# Patient Record
Sex: Male | Born: 1970 | Race: White | Hispanic: No | Marital: Married | Smoking: Never smoker
Health system: Southern US, Community
[De-identification: ages and names within clinical notes are randomized; demographics above are authoritative.]

## PROBLEM LIST (undated history)

## (undated) DIAGNOSIS — E079 Disorder of thyroid, unspecified: Secondary | ICD-10-CM

## (undated) DIAGNOSIS — U071 COVID-19: Secondary | ICD-10-CM

## (undated) DIAGNOSIS — F419 Anxiety disorder, unspecified: Secondary | ICD-10-CM

## (undated) HISTORY — PX: HIP SURGERY: SHX245

## (undated) HISTORY — PX: CHOLECYSTECTOMY: SHX55

## (undated) HISTORY — PX: KNEE SURGERY: SHX244

---

## 2009-08-07 ENCOUNTER — Ambulatory Visit: Payer: Self-pay | Admitting: Family Medicine

## 2009-08-07 DIAGNOSIS — M545 Low back pain, unspecified: Secondary | ICD-10-CM | POA: Insufficient documentation

## 2009-09-25 ENCOUNTER — Ambulatory Visit: Payer: Self-pay | Admitting: Family Medicine

## 2009-09-25 DIAGNOSIS — S61209A Unspecified open wound of unspecified finger without damage to nail, initial encounter: Secondary | ICD-10-CM | POA: Insufficient documentation

## 2010-04-21 ENCOUNTER — Ambulatory Visit: Payer: Self-pay | Admitting: Diagnostic Radiology

## 2010-04-21 ENCOUNTER — Emergency Department (HOSPITAL_BASED_OUTPATIENT_CLINIC_OR_DEPARTMENT_OTHER): Admission: EM | Admit: 2010-04-21 | Discharge: 2010-04-21 | Payer: Self-pay | Admitting: Emergency Medicine

## 2011-03-08 LAB — BASIC METABOLIC PANEL WITH GFR
BUN: 15 mg/dL (ref 6–23)
Calcium: 9.5 mg/dL (ref 8.4–10.5)
Chloride: 107 meq/L (ref 96–112)
GFR calc Af Amer: >60 mL/min (ref >60)
Glucose, Bld: 99 mg/dL (ref 70–99)

## 2011-03-08 LAB — BASIC METABOLIC PANEL
CO2: 29 mEq/L (ref 19–32)
Creatinine, Ser: 1 mg/dL (ref 0.4–1.5)
GFR calc non Af Amer: >60 mL/min (ref >60)
Potassium: 4.8 mEq/L (ref 3.5–5.1)
Sodium: 143 mEq/L (ref 135–145)

## 2012-10-18 ENCOUNTER — Emergency Department
Admission: EM | Admit: 2012-10-18 | Discharge: 2012-10-18 | Disposition: A | Discharge status: Home / Self Care | Payer: BC Managed Care – PPO | Source: Home / Self Care | Attending: Family Medicine | Admitting: Family Medicine

## 2012-10-18 ENCOUNTER — Encounter: Payer: Self-pay | Admitting: *Deleted

## 2012-10-18 DIAGNOSIS — IMO0002 Reserved for concepts with insufficient information to code with codable children: Secondary | ICD-10-CM

## 2012-10-18 DIAGNOSIS — L600 Ingrowing nail: Secondary | ICD-10-CM

## 2012-10-18 HISTORY — DX: Anxiety disorder, unspecified: F41.9

## 2012-10-18 MED ORDER — HYDROCODONE-ACETAMINOPHEN 5-500 MG PO TABS: 1.0000 | ORAL_TABLET | Freq: Every evening | ORAL | Status: DC | PRN

## 2012-10-18 MED ORDER — CEPHALEXIN 500 MG PO CAPS: 500.0000 mg | ORAL_CAPSULE | Freq: Three times a day (TID) | ORAL | Status: DC

## 2012-10-18 NOTE — ED Notes (Signed)
Patient c/o ingrown toe nail right great toe x 2 wks. Pain, swelling, redness, and discharge.

## 2012-10-18 NOTE — ED Provider Notes (Signed)
History     CSN: [id number]  Arrival date & time 10/18/12  1642   First MD Initiated Contact with Patient 10/18/12 1706      Chief Complaint  Patient presents with  . Ingrown Toenail      HPI Comments: Patient developed an ingrown toenail of his right great toe about two weeks ago.  He tried to trim it and it subsequently became red, swollen, and painful.  He has had persistent recurring purulent drainage from the area.  Patient is a 41 y.o. male presenting with toe pain. The history is provided by the patient.  Toe Pain This is a new problem. Episode onset: 2 weeks ago. The problem occurs constantly. The problem has been gradually worsening. Associated symptoms comments: none. The symptoms are aggravated by walking and standing. Nothing relieves the symptoms. Treatments tried: warm soaks. The treatment provided no relief.    Past Medical History  Diagnosis Date  . Anxiety     Past Surgical History  Procedure Date  . Knee surgery   . Cholecystectomy     History reviewed. No pertinent family history.  History  Substance Use Topics  . Smoking status: Never Smoker   . Smokeless tobacco: Never Used  . Alcohol Use: No      Review of Systems  All other systems reviewed and are negative.    Allergies  Amoxicillin  Home Medications   Current Outpatient Rx  Name Route Sig Dispense Refill  . ZOLOFT PO Oral Take by mouth.    . CEPHALEXIN 500 MG PO CAPS Oral Take 1 capsule (500 mg total) by mouth 3 (three) times daily. 30 capsule 0  . HYDROCODONE-ACETAMINOPHEN 5-500 MG PO TABS Oral Take 1 tablet by mouth at bedtime as needed for pain. 8 tablet 0    BP 151/85  Pulse 76  Temp 98.8 F (37.1 C) (Oral)  Resp 20  Ht 6\' 2"  (1.88 m)  Wt 260 lb (117.935 kg)  BMI 33.38 kg/m2  SpO2 99%  Physical Exam  Nursing note and vitals reviewed. Constitutional: He is oriented to person, place, and time. He appears well-developed and well-nourished. No distress.  Eyes:  Conjunctivae normal are normal.  Musculoskeletal:       Feet:       Right great toenail has erythema, swelling, and tenderness along its lateral edge as noted on diagram.  There is crusted discharge present.  Neurological: He is alert and oriented to person, place, and time.  Skin: Skin is warm and dry. There is erythema.    ED Course  Procedures none   Labs Reviewed  WOUND CULTURE pending      1. Ingrown right big toenail   2. Paronychia       MDM  Wound culture pending Begin Keflex.  Lortab at bedtime for pain. Continue warm soaks two or three times daily.  Wear bandage and change daily. Return in about 4 to 5 days for partial toenail resection                                   Lattie Haw, MD 10/18/12 1735

## 2012-10-21 ENCOUNTER — Telehealth: Payer: Self-pay | Admitting: *Deleted

## 2012-10-21 LAB — WOUND CULTURE

## 2013-08-08 ENCOUNTER — Emergency Department
Admission: EM | Admit: 2013-08-08 | Discharge: 2013-08-08 | Disposition: A | Discharge status: Home / Self Care | Payer: BC Managed Care – PPO | Source: Home / Self Care | Attending: Emergency Medicine | Admitting: Emergency Medicine

## 2013-08-08 ENCOUNTER — Encounter: Payer: Self-pay | Admitting: Emergency Medicine

## 2013-08-08 DIAGNOSIS — J069 Acute upper respiratory infection, unspecified: Secondary | ICD-10-CM

## 2013-08-08 MED ORDER — GUAIFENESIN-CODEINE 100-10 MG/5ML PO SYRP: 5.0000 mL | ORAL_SOLUTION | Freq: Four times a day (QID) | ORAL | Status: DC | PRN

## 2013-08-08 MED ORDER — AZITHROMYCIN 250 MG PO TABS: ORAL_TABLET | ORAL | Status: DC

## 2013-08-08 NOTE — ED Provider Notes (Signed)
  CSN: [id number]     Arrival date & time 08/08/13  1191 History     First MD Initiated Contact with Patient 08/08/13 [id number]     No chief complaint on file.  (Consider location/radiation/quality/duration/timing/severity/associated sxs/prior Treatment) HPI Keith Wong is a 42 y.o. male who complains of onset of cold symptoms for 3 days.  The symptoms are constant and mild-moderate in severity.  Taking Sudafed which helps some. + sore throat No cough No pleuritic pain No wheezing + nasal congestion + post-nasal drainage + sinus pain/pressure No chest congestion No itchy/red eyes No earache No hemoptysis No SOB No chills/sweats No fever No nausea No vomiting No abdominal pain No diarrhea No skin rashes No fatigue No myalgias + headache     Past Medical History  Diagnosis Date  . Anxiety    Past Surgical History  Procedure Laterality Date  . Knee surgery    . Cholecystectomy     No family history on file. History  Substance Use Topics  . Smoking status: Never Smoker   . Smokeless tobacco: Never Used  . Alcohol Use: No    Review of Systems  All other systems reviewed and are negative.    Allergies  Amoxicillin  Home Medications   Current Outpatient Rx  Name  Route  Sig  Dispense  Refill  . cephALEXin (KEFLEX) 500 MG capsule   Oral   Take 1 capsule (500 mg total) by mouth 3 (three) times daily.   30 capsule   0   . HYDROcodone-acetaminophen (VICODIN) 5-500 MG per tablet   Oral   Take 1 tablet by mouth at bedtime as needed for pain.   8 tablet   0   . Sertraline HCl (ZOLOFT PO)   Oral   Take by mouth.          There were no vitals taken for this visit. Physical Exam  Nursing note and vitals reviewed. Constitutional: He is oriented to person, place, and time. He appears well-developed and well-nourished.  HENT:  Head: Normocephalic and atraumatic.  Right Ear: Tympanic membrane, external ear and ear canal normal.  Left Ear: Tympanic membrane,  external ear and ear canal normal.  Nose: Mucosal edema and rhinorrhea present.  Mouth/Throat: Posterior oropharyngeal erythema present. No oropharyngeal exudate or posterior oropharyngeal edema.  Eyes: No scleral icterus.  Neck: Neck supple.  Cardiovascular: Regular rhythm and normal heart sounds.   Pulmonary/Chest: Effort normal and breath sounds normal. No respiratory distress.  Neurological: He is alert and oriented to person, place, and time.  Skin: Skin is warm and dry.  Psychiatric: He has a normal mood and affect. His speech is normal.    ED Course   Procedures (including critical care time)  Labs Reviewed - No data to display No results found. 1. Acute upper respiratory infections of unspecified site     MDM  1)  Take the prescribed antibiotic as instructed. 2)  Use nasal saline solution (over the counter) at least 3 times a day. 3)  Use over the counter decongestants like Zyrtec-D every 12 hours as needed to help with congestion.  If you have hypertension, do not take medicines with sudafed.  4)  Can take tylenol every 6 hours or motrin every 8 hours for pain or fever. 5)  Follow up with your primary doctor if no improvement in 5-7 days, sooner if increasing pain, fever, or new symptoms.     Marlaine Hind, MD 08/08/13 [id number]

## 2013-08-08 NOTE — ED Notes (Signed)
Sinus pain pressure, congestion, runny nose, headache x 3 days

## 2013-12-27 ENCOUNTER — Emergency Department (INDEPENDENT_AMBULATORY_CARE_PROVIDER_SITE_OTHER)
Admission: EM | Admit: 2013-12-27 | Discharge: 2013-12-27 | Disposition: A | Discharge status: Home / Self Care | Payer: BC Managed Care – PPO | Source: Home / Self Care | Attending: Family Medicine | Admitting: Family Medicine

## 2013-12-27 ENCOUNTER — Encounter: Payer: Self-pay | Admitting: Emergency Medicine

## 2013-12-27 DIAGNOSIS — J069 Acute upper respiratory infection, unspecified: Secondary | ICD-10-CM

## 2013-12-27 MED ORDER — AZITHROMYCIN 250 MG PO TABS: ORAL_TABLET | ORAL | Status: DC

## 2013-12-27 MED ORDER — HYDROCOD POLST-CHLORPHEN POLST 10-8 MG/5ML PO LQCR: 5.0000 mL | Freq: Every evening | ORAL | Status: DC | PRN

## 2013-12-27 NOTE — ED Notes (Signed)
Pt c/o dry cough, HA, sneezing, nasal congestion without fever x 5 days. No flu vac this season.

## 2013-12-27 NOTE — Discharge Instructions (Signed)
Take plain Mucinex (1200 mg guaifenesin) twice daily for cough and congestion.  May add Sudafed for sinus congestion.   Increase fluid intake, rest. May use Afrin nasal spray (or generic oxymetazoline) twice daily for about 5 days.  Also recommend using saline nasal spray several times daily and saline nasal irrigation (AYR is a common brand) Try warm salt water gargles for sore throat.  Stop all antihistamines for now, and other non-prescription cough/cold preparations. May take Ibuprofen 200mg , 4 tabs every 8 hours with food for sore throat, headache, etc. Begin Azithromycin if not improving about 5 days or if persistent fever develops   Follow-up with family doctor if not improving about10 days.

## 2013-12-27 NOTE — ED Provider Notes (Signed)
CSN: [id number]     Arrival date & time 12/27/13  1351 History   First MD Initiated Contact with Patient 12/27/13 1437     Chief Complaint  Patient presents with  . Cough  . Nasal Congestion      HPI Comments: Patient complains of five day history of gradually developing URI symptoms with mild sore throat, sinus congestion, and cough.  The history is provided by the patient.    Past Medical History  Diagnosis Date  . Anxiety    Past Surgical History  Procedure Laterality Date  . Knee surgery    . Cholecystectomy     History reviewed. No pertinent family history. History  Substance Use Topics  . Smoking status: Never Smoker   . Smokeless tobacco: Never Used  . Alcohol Use: No    Review of Systems + sore throat + cough + hoarseness No pleuritic pain No wheezing + nasal congestion + post-nasal drainage No sinus pain/pressure No itchy/red eyes No earache No hemoptysis No SOB No fever, + chills/sweats + nausea No vomiting No abdominal pain No diarrhea No urinary symptoms No skin rash + fatigue No myalgias + headache Used OTC meds without relief  Allergies  Amoxicillin  Home Medications   Current Outpatient Rx  Name  Route  Sig  Dispense  Refill  . montelukast (SINGULAIR) 10 MG tablet   Oral   Take 10 mg by mouth at bedtime.         Marland Kitchen. azithromycin (ZITHROMAX Z-PAK) 250 MG tablet      Take 2 tabs today; then begin one tab once daily for 4 more days. (Rx void after 01/04/14)   6 each   0   . chlorpheniramine-HYDROcodone (TUSSIONEX PENNKINETIC ER) 10-8 MG/5ML LQCR   Oral   Take 5 mLs by mouth at bedtime as needed for cough.   115 mL   0    BP 132/89  Pulse 79  Temp(Src) 98.9 F (37.2 C) (Oral)  Resp 16  Ht 6\' 2"  (1.88 m)  Wt 265 lb (120.203 kg)  BMI 34.01 kg/m2  SpO2 99% Physical Exam Nursing notes and Vital Signs reviewed. Appearance:  Patient appears healthy, stated age, and in no acute distress Eyes:  Pupils are equal, round, and  reactive to light and accomodation.  Extraocular movement is intact.  Conjunctivae are not inflamed  Ears:  Canals normal.  Tympanic membranes normal.  Nose:  Mildly congested turbinates.  No sinus tenderness.    Pharynx:  Normal Neck:  Supple.   Non-tender posterior nodes are palpated bilaterally  Lungs:  Clear to auscultation.  Breath sounds are equal.  Heart:  Regular rate and rhythm without murmurs, rubs, or gallops.  Abdomen:  Nontender without masses or hepatosplenomegaly.  Bowel sounds are present.  No CVA or flank tenderness.  Extremities:  No edema.  No calf tenderness Skin:  No rash present.   ED Course  Procedures  none      MDM   1. Acute upper respiratory infections of unspecified site; suspect viral URI    There is no evidence of bacterial infection today.  Rx for Tussionex at bedtime prn Take plain Mucinex (1200 mg guaifenesin) twice daily for cough and congestion.  May add Sudafed for sinus congestion.   Increase fluid intake, rest. May use Afrin nasal spray (or generic oxymetazoline) twice daily for about 5 days.  Also recommend using saline nasal spray several times daily and saline nasal irrigation (AYR is a common brand) Try  warm salt water gargles for sore throat.  Stop all antihistamines for now, and other non-prescription cough/cold preparations. May take Ibuprofen 200mg , 4 tabs every 8 hours with food for sore throat, headache, etc. Begin Azithromycin if not improving about 5 days or if persistent fever develops (Given a prescription to hold, with an expiration date)  Follow-up with family doctor if not improving about10 days.     Lattie Haw, MD 12/29/13 1048

## 2015-04-02 ENCOUNTER — Emergency Department
Admission: EM | Admit: 2015-04-02 | Discharge: 2015-04-02 | Disposition: A | Discharge status: Home / Self Care | Payer: BLUE CROSS/BLUE SHIELD | Source: Home / Self Care | Attending: Emergency Medicine | Admitting: Emergency Medicine

## 2015-04-02 ENCOUNTER — Encounter: Payer: Self-pay | Admitting: *Deleted

## 2015-04-02 DIAGNOSIS — J322 Chronic ethmoidal sinusitis: Secondary | ICD-10-CM

## 2015-04-02 HISTORY — DX: Disorder of thyroid, unspecified: E07.9

## 2015-04-02 MED ORDER — AZITHROMYCIN 250 MG PO TABS: ORAL_TABLET | ORAL | Status: DC

## 2015-04-02 MED ORDER — HYDROCODONE-HOMATROPINE 5-1.5 MG/5ML PO SYRP: 5.0000 mL | ORAL_SOLUTION | Freq: Four times a day (QID) | ORAL | Status: DC | PRN

## 2015-04-02 MED ORDER — PREDNISONE 10 MG PO TABS: ORAL_TABLET | ORAL | Status: DC

## 2015-04-02 NOTE — Discharge Instructions (Signed)

## 2015-04-02 NOTE — ED Provider Notes (Signed)
CSN: [id number]     Arrival date & time 04/02/15  1009 History   First MD Initiated Contact with Patient 04/02/15 1054     Chief Complaint  Patient presents with  . Sinus Problem  . Cough   (Consider location/radiation/quality/duration/timing/severity/associated sxs/prior Treatment) Patient is a 44 y.o. male presenting with sinus complaint and cough. The history is provided by the patient. No language interpreter was used.  Sinus Problem This is a new problem. The current episode started 2 days ago. The problem occurs constantly. The problem has been gradually worsening. Associated symptoms include headaches. Pertinent negatives include no shortness of breath. Nothing aggravates the symptoms. Nothing relieves the symptoms. He has tried nothing for the symptoms. The treatment provided no relief.  Cough Associated symptoms: headaches   Associated symptoms: no shortness of breath   Pt complains of sinus congestion, cough and congestion.  Pt reports he has had multiple sinus infections.  Pt has had 3 since December.  Past Medical History  Diagnosis Date  . Anxiety   . Thyroid disease    Past Surgical History  Procedure Laterality Date  . Knee surgery    . Cholecystectomy     History reviewed. No pertinent family history. History  Substance Use Topics  . Smoking status: Never Smoker   . Smokeless tobacco: Never Used  . Alcohol Use: No    Review of Systems  HENT: Positive for sinus pressure.   Respiratory: Positive for cough. Negative for shortness of breath.   Neurological: Positive for headaches.  All other systems reviewed and are negative.   Allergies  Amoxicillin  Home Medications   Prior to Admission medications   Medication Sig Start Date End Date Taking? Authorizing Provider  buPROPion (WELLBUTRIN) 75 MG tablet Take 25 mg by mouth 2 (two) times daily.   Yes Historical Provider, MD  LEVOTHYROXINE SODIUM PO Take 0.25 mcg by mouth.   Yes Historical Provider, MD   azithromycin (ZITHROMAX Z-PAK) 250 MG tablet Two tablets 1st day then one a day 04/02/15   Elson Areas, PA-C  HYDROcodone-homatropine (HYDROMET) 5-1.5 MG/5ML syrup Take 5 mLs by mouth every 6 (six) hours as needed for cough. 04/02/15   Elson Areas, PA-C  montelukast (SINGULAIR) 10 MG tablet Take 10 mg by mouth at bedtime.    Historical Provider, MD  predniSONE (DELTASONE) 10 MG tablet 6,5,4,3,2,1 taper 04/02/15   Lonia Skinner Kaegan Hettich, PA-C   BP 132/90 mmHg  Pulse 101  Temp(Src) 98 F (36.7 C) (Oral)  Resp 16  Wt 252 lb (114.306 kg)  SpO2 99% Physical Exam  Constitutional: He is oriented to person, place, and time. He appears well-developed and well-nourished.  HENT:  Head: Normocephalic and atraumatic.  Right Ear: External ear normal.  Mouth/Throat: Oropharynx is clear and moist.  Tender maxillary sinuses and frontal sinus  Eyes: Conjunctivae and EOM are normal. Pupils are equal, round, and reactive to light.  Neck: Normal range of motion.  Cardiovascular: Normal rate.   Pulmonary/Chest: Effort normal.  Abdominal: Soft.  Musculoskeletal: Normal range of motion.  Neurological: He is alert and oriented to person, place, and time. He has normal reflexes.  Skin: Skin is warm.  Psychiatric: He has a normal mood and affect.  Nursing note and vitals reviewed.   ED Course  Procedures (including critical care time) Labs Review Labs Reviewed - No data to display  Imaging Review No results found.   MDM  Pt given prednisone, zpack and hydromet syrup.   1. Ethmoid  sinusitis, unspecified chronicity    zithromax AVS     Elson AreasLeslie K Hillarie Harrigan, PA-C 04/02/15 1456

## 2015-04-02 NOTE — ED Notes (Signed)
Pt c/o sinus congestion, pain and productive cough x 2 days. Denies fever. H/o 3 sinus infections since this past December.

## 2015-06-27 ENCOUNTER — Encounter: Payer: Self-pay | Admitting: Emergency Medicine

## 2015-06-27 ENCOUNTER — Emergency Department
Admission: EM | Admit: 2015-06-27 | Discharge: 2015-06-27 | Disposition: A | Discharge status: Home / Self Care | Payer: BLUE CROSS/BLUE SHIELD | Source: Home / Self Care | Attending: Family Medicine | Admitting: Family Medicine

## 2015-06-27 DIAGNOSIS — J0111 Acute recurrent frontal sinusitis: Secondary | ICD-10-CM

## 2015-06-27 MED ORDER — HYDROCODONE-HOMATROPINE 5-1.5 MG/5ML PO SYRP: 5.0000 mL | ORAL_SOLUTION | Freq: Four times a day (QID) | ORAL | Status: DC | PRN

## 2015-06-27 MED ORDER — AZITHROMYCIN 250 MG PO TABS: 250.0000 mg | ORAL_TABLET | Freq: Every day | ORAL | Status: DC

## 2015-06-27 NOTE — ED Notes (Signed)
Patient presents with C/O facial pain and mild headache. Symptoms time 1 week. Patient advises that he has had reoccuring sinus infections.

## 2015-06-27 NOTE — Discharge Instructions (Signed)
Today your diagnosed with a sinus infection and were prescribed an antibiotic.  Please take entire course of antibiotic, even if you start feeling better.  If you stop taking prior to completion infection may come back.  You have also been prescribed a cough medication that has hydrocodone in it.  This medication is a narcotic. Do not to drink alcohol, drive, or perform any other activities that requires focus while taking this medication.  It is best to take prior to bedtime to help you sleep.  May take ibuprofen as needed for fever or pain.  Please use nasal saline rinses to help clear your sinuses.  Be sure to get plenty of rest and drink lots of fluids.  Follow-up with your primary care provider in one week if symptoms aren't improving.  Follow-up sooner if symptoms worsening or return to urgent care.  See below for further instructions.  Sinusitis Sinusitis is redness, soreness, and inflammation of the paranasal sinuses. Paranasal sinuses are air pockets within the bones of your face (beneath the eyes, the middle of the forehead, or above the eyes). In healthy paranasal sinuses, mucus is able to drain out, and air is able to circulate through them by way of your nose. However, when your paranasal sinuses are inflamed, mucus and air can become trapped. This can allow bacteria and other germs to grow and cause infection. Sinusitis can develop quickly and last only a short time (acute) or continue over a long period (chronic). Sinusitis that lasts for more than 12 weeks is considered chronic.  CAUSES  Causes of sinusitis include:  Allergies.  Structural abnormalities, such as displacement of the cartilage that separates your nostrils (deviated septum), which can decrease the air flow through your nose and sinuses and affect sinus drainage.  Functional abnormalities, such as when the small hairs (cilia) that line your sinuses and help remove mucus do not work properly or are not present. SIGNS AND  SYMPTOMS  Symptoms of acute and chronic sinusitis are the same. The primary symptoms are pain and pressure around the affected sinuses. Other symptoms include:  Upper toothache.  Earache.  Headache.  Bad breath.  Decreased sense of smell and taste.  A cough, which worsens when you are lying flat.  Fatigue.  Fever.  Thick drainage from your nose, which often is green and may contain pus (purulent).  Swelling and warmth over the affected sinuses. DIAGNOSIS  Your health care provider will perform a physical exam. During the exam, your health care provider may:  Look in your nose for signs of abnormal growths in your nostrils (nasal polyps).  Tap over the affected sinus to check for signs of infection.  View the inside of your sinuses (endoscopy) using an imaging device that has a light attached (endoscope). If your health care provider suspects that you have chronic sinusitis, one or more of the following tests may be recommended:  Allergy tests.  Nasal culture. A sample of mucus is taken from your nose, sent to a lab, and screened for bacteria.  Nasal cytology. A sample of mucus is taken from your nose and examined by your health care provider to determine if your sinusitis is related to an allergy. TREATMENT  Most cases of acute sinusitis are related to a viral infection and will resolve on their own within 10 days. Sometimes medicines are prescribed to help relieve symptoms (pain medicine, decongestants, nasal steroid sprays, or saline sprays).  However, for sinusitis related to a bacterial infection, your health care  provider will prescribe antibiotic medicines. These are medicines that will help kill the bacteria causing the infection.  Rarely, sinusitis is caused by a fungal infection. In theses cases, your health care provider will prescribe antifungal medicine. For some cases of chronic sinusitis, surgery is needed. Generally, these are cases in which sinusitis recurs  more than 3 times per year, despite other treatments. HOME CARE INSTRUCTIONS   Drink plenty of water. Water helps thin the mucus so your sinuses can drain more easily.  Use a humidifier.  Inhale steam 3 to 4 times a day (for example, sit in the bathroom with the shower running).  Apply a warm, moist washcloth to your face 3 to 4 times a day, or as directed by your health care provider.  Use saline nasal sprays to help moisten and clean your sinuses.  Take medicines only as directed by your health care provider.  If you were prescribed either an antibiotic or antifungal medicine, finish it all even if you start to feel better. SEEK IMMEDIATE MEDICAL CARE IF:  You have increasing pain or severe headaches.  You have nausea, vomiting, or drowsiness.  You have swelling around your face.  You have vision problems.  You have a stiff neck.  You have difficulty breathing. MAKE SURE YOU:   Understand these instructions.  Will watch your condition.  Will get help right away if you are not doing well or get worse. Document Released: 12/05/2005 Document Revised: 04/21/2014 Document Reviewed: 12/20/2011 [facility] Patient Information 2015 [place], [place]. This information is not intended to replace advice given to you by your health care provider. Make sure you discuss any questions you have with your health care provider.

## 2015-06-27 NOTE — ED Provider Notes (Signed)
CSN: [id number]     Arrival date & time 06/27/15  1120 History   First MD Initiated Contact with Patient 06/27/15 1140     Chief Complaint  Patient presents with  . Facial Pain   (Consider location/radiation/quality/duration/timing/severity/associated sxs/prior Treatment) HPI Patient is a 44 year old male presenting to urgent care with complaint of gradually worsening facial pain, nasal congestion and bilateral ear pain started 1 week ago.  Patient reports history of recurrent sinus infections.  States this feels similar to prior episodes.  States normally symptoms worsen and develop congestion, cough into his chest.  Patient has had mild intermittent productive cough with white mucus.  Denies difficulty breathing or swallowing.  Patient does have mild to moderate frontal headaches that are slow in onset.  Worse at night when he lays down, as he states he feels congested and popping in his sinuses.  He has tried [facility] powder without relief.  He is also tried Flonase with minimal relief.  Denies fevers, chills, nausea, vomiting or diarrhea.  Denies sick contacts or recent travel.  Denies tick bites.  Denies rashes.  Patient is allergic to amoxicillin.  Past Medical History  Diagnosis Date  . Anxiety   . Thyroid disease    Past Surgical History  Procedure Laterality Date  . Knee surgery    . Cholecystectomy     History reviewed. No pertinent family history. History  Substance Use Topics  . Smoking status: Never Smoker   . Smokeless tobacco: Never Used  . Alcohol Use: No    Review of Systems  Constitutional: Negative for fever, chills, diaphoresis, appetite change and fatigue.  HENT: Positive for congestion, ear pain, rhinorrhea and sinus pressure. Negative for sore throat, trouble swallowing and voice change.   Respiratory: Positive for cough. Negative for shortness of breath, wheezing and stridor.   Cardiovascular: Negative for chest pain, palpitations and leg swelling.   Gastrointestinal: Negative for nausea, vomiting, abdominal pain and diarrhea.  Musculoskeletal: Negative for myalgias, back pain, neck pain and neck stiffness.  Neurological: Positive for headaches (frontal). Negative for dizziness, syncope, weakness and light-headedness.    Allergies  Amoxicillin  Home Medications   Prior to Admission medications   Medication Sig Start Date End Date Taking? Authorizing Provider  azithromycin (ZITHROMAX) 250 MG tablet Take 1 tablet (250 mg total) by mouth daily. Take first 2 tablets together, then 1 every day until finished. 06/27/15   Junius Finner, PA-C  buPROPion (WELLBUTRIN) 75 MG tablet Take 25 mg by mouth 2 (two) times daily.    Historical Provider, MD  HYDROcodone-homatropine (HYDROMET) 5-1.5 MG/5ML syrup Take 5 mLs by mouth every 6 (six) hours as needed for cough. 04/02/15   Elson Areas, PA-C  HYDROcodone-homatropine (HYDROMET) 5-1.5 MG/5ML syrup Take 5 mLs by mouth every 6 (six) hours as needed for cough. 06/27/15   Junius Finner, PA-C  LEVOTHYROXINE SODIUM PO Take 0.25 mcg by mouth.    Historical Provider, MD  montelukast (SINGULAIR) 10 MG tablet Take 10 mg by mouth at bedtime.    Historical Provider, MD  predniSONE (DELTASONE) 10 MG tablet 6,5,4,3,2,1 taper 04/02/15   Lonia Skinner Sofia, PA-C   BP 116/82 mmHg  Temp(Src) 98.3 F (36.8 C) (Oral)  Resp 16  Ht  (1.88 m)  Wt 247 lb (112.038 kg)  BMI 31.70 kg/m2  SpO2 100% Physical Exam  Constitutional: He appears well-developed and well-nourished.  Patient sitting in exam chair, appears well, nontoxic.  No acute distress.  HENT:  Head: Normocephalic and  atraumatic.  Right Ear: Hearing, tympanic membrane, external ear and ear canal normal.  Left Ear: Hearing, tympanic membrane, external ear and ear canal normal.  Nose: Mucosal edema and rhinorrhea present. Right sinus exhibits frontal sinus tenderness. Right sinus exhibits no maxillary sinus tenderness. Left sinus exhibits frontal sinus  tenderness. Left sinus exhibits no maxillary sinus tenderness.  Mouth/Throat: Uvula is midline, oropharynx is clear and moist and mucous membranes are normal.  Eyes: Conjunctivae are normal. No scleral icterus.  Neck: Normal range of motion. Neck supple.  No nuchal rigidity or meningeal signs.  Cardiovascular: Normal rate, regular rhythm and normal heart sounds.   Pulmonary/Chest: Effort normal and breath sounds normal. No respiratory distress. He has no wheezes. He has no rales. He exhibits no tenderness.  Abdominal: Soft. Bowel sounds are normal. He exhibits no distension and no mass. There is no tenderness. There is no rebound and no guarding.  Musculoskeletal: Normal range of motion.  Neurological: He is alert.  Skin: Skin is warm and dry. No rash noted. No erythema.  Nursing note and vitals reviewed.   ED Course  Procedures (including critical care time) Labs Review Labs Reviewed - No data to display  Imaging Review No results found.   MDM   1. Acute recurrent frontal sinusitis    Patient is 44 year old male with history of recurrent sinus infections presenting to urgent care with 1 week history of nasal congestion and frontal headaches.  Exam is consistent with sinusitis.  Patient is allergic to amoxicillin, however, states he has had a Z-Pak prior which has helped her symptoms.  Patient also complains of thick some of the night. Will prescribe cough medication-hydromet, that has helped pt in the past.  Advised patient cough medication is a narcotic as it has hydrocodone in it.  Advised not to drink alcohol, drive or operate heavy machinery while taking as it may cause drowsiness.  Encouraged to get plenty of rest, drink lots of fluids and may take ibuprofen as needed for pain Follow-up with PCP in one week for recheck of symptoms.  If not improving. Return precautions provided. Pt verbalized understanding and agreement with tx plan.     Junius Finnerrin O'Malley, PA-C 06/27/15 1303

## 2015-06-28 ENCOUNTER — Telehealth: Payer: Self-pay | Admitting: Emergency Medicine

## 2015-10-14 ENCOUNTER — Encounter: Payer: Self-pay | Admitting: Emergency Medicine

## 2015-10-14 ENCOUNTER — Emergency Department
Admission: EM | Admit: 2015-10-14 | Discharge: 2015-10-14 | Disposition: A | Discharge status: Home / Self Care | Payer: BLUE CROSS/BLUE SHIELD | Source: Home / Self Care | Attending: Family Medicine | Admitting: Family Medicine

## 2015-10-14 DIAGNOSIS — Z23 Encounter for immunization: Secondary | ICD-10-CM | POA: Diagnosis not present

## 2015-10-14 DIAGNOSIS — L600 Ingrowing nail: Secondary | ICD-10-CM

## 2015-10-14 DIAGNOSIS — L03032 Cellulitis of left toe: Secondary | ICD-10-CM

## 2015-10-14 MED ORDER — CEPHALEXIN 500 MG PO CAPS: 500.0000 mg | ORAL_CAPSULE | Freq: Two times a day (BID) | ORAL | Status: DC

## 2015-10-14 MED ORDER — HYDROCODONE-ACETAMINOPHEN 5-325 MG PO TABS: 1.0000 | ORAL_TABLET | Freq: Four times a day (QID) | ORAL | Status: DC | PRN

## 2015-10-14 MED ORDER — IBUPROFEN 600 MG PO TABS: 600.0000 mg | ORAL_TABLET | Freq: Four times a day (QID) | ORAL | Status: DC | PRN

## 2015-10-14 MED ORDER — INFLUENZA VAC SPLIT QUAD 0.5 ML IM SUSY
0.5000 mL | PREFILLED_SYRINGE | Freq: Once | INTRAMUSCULAR | Status: AC
  Administered 2015-10-14: 0.5 mL via INTRAMUSCULAR

## 2015-10-14 NOTE — ED Notes (Signed)
Has been noticing pain and redness on left large toenail area for about 2 weeks. Concerned about possible infection and/or need for removal.

## 2015-10-14 NOTE — ED Provider Notes (Signed)
CSN: [id number]     Arrival date & time 10/14/15  1447 History   First MD Initiated Contact with Patient 10/14/15 1450     Chief Complaint  Patient presents with  . Nail Problem   (Consider location/radiation/quality/duration/timing/severity/associated sxs/prior Treatment) HPI Pt is a 44yo [facility]/o worsening Left great toe pain, redness, and swelling secondary to an ingrown toenail for 2 weeks. Pain is aching and throbbing, worse with ambulation and palpation.  He has been soaking his foot several times a day and using peroxide w/o relief.  Pt is concerned for infection and questions if he needs his nail removed.  Pt reports hx of same and had a partial nail removal of the Right great toe a few years ago.  That nail has grown back without complications.  Denies fever, chills, n/v/d.  Past Medical History  Diagnosis Date  . Anxiety   . Thyroid disease    Past Surgical History  Procedure Laterality Date  . Knee surgery    . Cholecystectomy    . Hip surgery Right    Family History  Problem Relation Age of Onset  . Heart failure Brother    Social History  Substance Use Topics  . Smoking status: Never Smoker   . Smokeless tobacco: Never Used  . Alcohol Use: No    Review of Systems  Constitutional: Negative for fever and chills.  Musculoskeletal: Positive for myalgias, joint swelling and arthralgias. Negative for gait problem.       Left great toenail  Skin: Positive for color change and wound.    Allergies  Amoxicillin  Home Medications   Prior to Admission medications   Medication Sig Start Date End Date Taking? Authorizing Provider  azithromycin (ZITHROMAX) 250 MG tablet Take 1 tablet (250 mg total) by mouth daily. Take first 2 tablets together, then 1 every day until finished. 06/27/15   Junius FinnerErin O'Malley, PA-C  buPROPion (WELLBUTRIN) 75 MG tablet Take 25 mg by mouth 2 (two) times daily.    Historical Provider, MD  cephALEXin (KEFLEX) 500 MG capsule  Take 1 capsule (500 mg total) by mouth 2 (two) times daily. For 7 days 10/14/15   Junius FinnerErin O'Malley, PA-C  HYDROcodone-acetaminophen (NORCO/VICODIN) 5-325 MG tablet Take 1-2 tablets by mouth every 6 (six) hours as needed for moderate pain or severe pain. 10/14/15   Junius FinnerErin O'Malley, PA-C  HYDROcodone-homatropine (HYDROMET) 5-1.5 MG/5ML syrup Take 5 mLs by mouth every 6 (six) hours as needed for cough. 04/02/15   Elson AreasLeslie K Sofia, PA-C  HYDROcodone-homatropine (HYDROMET) 5-1.5 MG/5ML syrup Take 5 mLs by mouth every 6 (six) hours as needed for cough. 06/27/15   Junius FinnerErin O'Malley, PA-C  ibuprofen (ADVIL,MOTRIN) 600 MG tablet Take 1 tablet (600 mg total) by mouth every 6 (six) hours as needed. 10/14/15   Junius FinnerErin O'Malley, PA-C  LEVOTHYROXINE SODIUM PO Take 0.25 mcg by mouth.    Historical Provider, MD  montelukast (SINGULAIR) 10 MG tablet Take 10 mg by mouth at bedtime.    Historical Provider, MD  predniSONE (DELTASONE) 10 MG tablet 6,5,4,3,2,1 taper 04/02/15   Elson AreasLeslie K Sofia, PA-C   Meds Ordered and Administered this Visit   Medications  Influenza vac split quadrivalent PF (FLUARIX) injection 0.5 mL (0.5 mLs Intramuscular Given 10/14/15 1530)    BP 136/82 mmHg  Pulse 78  Temp(Src) 98.4 F (36.9 C) (Oral)  Resp 16  Ht 6\' 3"  (1.905 m)  Wt 250 lb (113.399 kg)  BMI 31.25 kg/m2  SpO2 100% No  data found.   Physical Exam  Constitutional: He is oriented to person, place, and time. He appears well-developed and well-nourished.  HENT:  Head: Normocephalic and atraumatic.  Eyes: EOM are normal.  Neck: Normal range of motion.  Cardiovascular: Normal rate.   Pulmonary/Chest: Effort normal.  Musculoskeletal: Normal range of motion.  Neurological: He is alert and oriented to person, place, and time.  Skin: Skin is warm and dry. There is erythema.  Left great toe: ingrown nail on lateral aspect. Surrounding erythema, edema and tenderness. Small amount of dried blood. No drainage of pus.   Psychiatric: He has a  normal mood and affect. His behavior is normal.  Nursing note and vitals reviewed.   ED Course  .Nail Removal Date/Time: 10/14/2015 4:19 PM Performed by: Junius Finner Authorized by: Donna Christen A Consent: Verbal consent obtained. Risks and benefits: risks, benefits and alternatives were discussed Consent given by: patient Patient understanding: patient states understanding of the procedure being performed Patient consent: the patient's understanding of the procedure matches consent given Site marked: the operative site was marked Required items: required blood products, implants, devices, and special equipment available Patient identity confirmed: verbally with patient Time out: Immediately prior to procedure a "time out" was called to verify the correct patient, procedure, equipment, support staff and site/side marked as required. Location: left foot Location details: left big toe Anesthesia: digital block Local anesthetic: lidocaine 2% without epinephrine Anesthetic total: 4 ml Patient sedated: no Preparation: skin prepped with Betadine Amount removed: 1/4 Side: ulnar Wedge excision of skin of nail fold: no Nail bed sutured: no Nail matrix removed: partial Removed nail replaced and anchored: no Dressing: 4x4, antibiotic ointment and dressing applied Patient tolerance: Patient tolerated the procedure well with no immediate complications   (including critical care time)  Labs Review Labs Reviewed - No data to display  Imaging Review No results found.     MDM   1. Ingrown left greater toenail   2. Cellulitis of toe, left    Pt c/o worsening pain, redness, and swelling secondary to ingrown toenail of Left great toe.   Vitals: WNL,  Respiration rate is 16.   Exam c/w cellulitis of Left great toe due to ingrown nail. Rx: Keflex, ibuprofen and norco. Encouraged pt to soak foot several times a day in warm water and epson salt.  May also use OTC topical antibiotic  ointment for 5 days. Pt info packet on ingrown nails and cellulitis provided. Return for wound check in 3-4 days if not improving, sooner if worsening. Patient verbalized understanding and agreement with treatment plan.   Junius Finner, PA-C 10/14/15 [phone]

## 2015-10-14 NOTE — Discharge Instructions (Signed)
Please take antibiotics as prescribed and be sure to complete entire course even if you start to feel better to ensure infection does not come back.  Norco/Vicodin (hydrocodone-acetaminophen) is a narcotic pain medication, do not combine these medications with others containing tylenol. While taking, do not drink alcohol, drive, or perform any other activities that requires focus while taking these medications.   Ingrown Toenail An ingrown toenail occurs when the corner or sides of your toenail grow into the surrounding skin. The big toe is most commonly affected, but it can happen to any of your toes. If your ingrown toenail is not treated, you will be at risk for infection. CAUSES This condition may be caused by:  Wearing shoes that are too small or tight.  Injury or trauma, such as stubbing your toe or having your toe stepped on.  Improper cutting or care of your toenails.  Being born with (congenital) nail or foot abnormalities, such as having a nail that is too big for your toe. RISK FACTORS Risk factors for an ingrown toenail include:  Age. Your nails tend to thicken as you get older, so ingrown nails are more common in older people.  Diabetes.  Cutting your toenails incorrectly.  Blood circulation problems. SYMPTOMS Symptoms may include:  Pain, soreness, or tenderness.  Redness.  Swelling.  Hardening of the skin surrounding the toe. Your ingrown toenail may be infected if there is fluid, pus, or drainage. DIAGNOSIS  An ingrown toenail may be diagnosed by medical history and physical exam. If your toenail is infected, your health care provider may test a sample of the drainage. TREATMENT Treatment depends on the severity of your ingrown toenail. Some ingrown toenails may be treated at home. More severe or infected ingrown toenails may require surgery to remove all or part of the nail. Infected ingrown toenails may also be treated with antibiotic medicines. HOME CARE  INSTRUCTIONS  If you were prescribed an antibiotic medicine, finish all of it even if you start to feel better.  Soak your foot in warm soapy water for 20 minutes, 3 times per day or as directed by your health care provider.  Carefully lift the edge of the nail away from the sore skin by wedging a small piece of cotton under the corner of the nail. This may help with the pain. Be careful not to cause more injury to the area.  Wear shoes that fit well. If your ingrown toenail is causing you pain, try wearing sandals, if possible.  Trim your toenails regularly and carefully. Do not cut them in a curved shape. Cut your toenails straight across. This prevents injury to the skin at the corners of the toenail.  Keep your feet clean and dry.  If you are having trouble walking and are given crutches by your health care provider, use them as directed.  Do not pick at your toenail or try to remove it yourself.  Take medicines only as directed by your health care provider.  Keep all follow-up visits as directed by your health care provider. This is important. SEEK MEDICAL CARE IF:  Your symptoms do not improve with treatment. SEEK IMMEDIATE MEDICAL CARE IF:  You have red streaks that start at your foot and go up your leg.  You have a fever.  You have increased redness, swelling, or pain.  You have fluid, blood, or pus coming from your toenail.   This information is not intended to replace advice given to you by your health care  provider. Make sure you discuss any questions you have with your health care provider.   Document Released: 12/02/2000 Document Revised: 04/21/2015 Document Reviewed: 10/29/2014 Elsevier Interactive Patient Education 2016 [employer]  Fingernail or Toenail Removal, Care After Refer to this sheet in the next few weeks. These instructions provide you with information about caring for yourself after your procedure. Your health care provider may also give you more  specific instructions. Your treatment has been planned according to current medical practices, but problems sometimes occur. Call your health care provider if you have any problems or questions after your procedure. WHAT TO EXPECT AFTER THE PROCEDURE After your procedure, it is common to have:  Redness.  Swelling. HOME CARE INSTRUCTIONS  If you have a splint on your finger:  Wear it as directed by your health care provider. Remove it only as directed by your health care provider.  Loosen the splint if your fingers become numb and tingle, or if they turn cold and blue.  If you were given a surgical shoe, wear it as directed by your health care provider.  Take medicines only as directed by your health care provider.  Elevate your hand or foot as much of the time as possible. This helps with pain and swelling.  If you are recovering from fingernail removal, keep your hand raised above the level of your heart.  If you are recovering from toenail removal, lie on a bed or a couch with your leg propped up on pillows, or sit in a reclining chair with the footrest up.  Follow instructions from your health care provider about bandage (dressing) changes and removal:  Change your dressing 24 hours after your procedure or as directed by your health care provider.  Soak your hand or foot in warm, soapy water for 10-20 minutes or as directed by your health care provider. Do this 3 times per day or as directed by your health care provider. This reduces pain and swelling.  After you soak your hand or foot, apply a clean, dry dressing.  Keep your dressing clean and dry. Change your dressing whenever it gets wet or dirty.  Keep all follow-up visits as directed by your health care provider. This is important. SEEK MEDICAL CARE IF:  You have increased redness or pain at your nail area.  You have increased fluid, blood, or pus coming from your nail area.  There is a bad smell coming from the  dressing.  You have a fever.  Your swelling gets worse, or you have swelling that spreads from your finger to your hand or from your toe to your foot.  You have worsening redness that spreads from your finger to your hand or from your toe up to your foot.  Your finger or toe looks blue or black.   This information is not intended to replace advice given to you by your health care provider. Make sure you discuss any questions you have with your health care provider.   Document Released: 12/26/2014 Document Reviewed: 12/26/2014 Elsevier Interactive Patient Education 2016 [employer]  Paronychia Paronychia is an infection of the skin that surrounds a nail. It usually affects the skin around a fingernail, but it may also occur near a toenail. It often causes pain and swelling around the nail. This condition may come on suddenly or develop over a longer period. In some cases, a collection of pus (abscess) can form near or under the nail. Usually, paronychia is not serious and it clears up  with treatment. CAUSES This condition may be caused by bacteria or fungi. It is commonly caused by either Streptococcus or Staphylococcus bacteria. The bacteria or fungi often cause the infection by getting into the affected area through an opening in the skin, such as a cut or a hangnail. RISK FACTORS This condition is more likely to develop in:  People who get their hands wet often, such as those who work as Fish farm manager, bartenders, or nurses.  People who bite their fingernails or suck their thumbs.  People who trim their nails too short.  People who have hangnails or injured fingertips.  People who get manicures.  People who have diabetes. SYMPTOMS Symptoms of this condition include:  Redness and swelling of the skin near the nail.  Tenderness around the nail when you touch the area.  Pus-filled bumps under the cuticle. The cuticle is the skin at the base or sides of the nail.  Fluid or  pus under the nail.  Throbbing pain in the area. DIAGNOSIS This condition is usually diagnosed with a physical exam. In some cases, a sample of pus may be taken from an abscess to be tested in a lab. This can help to determine what type of bacteria or fungi is causing the condition. TREATMENT Treatment for this condition depends on the cause and severity of the condition. If the condition is mild, it may clear up on its own in a few days. Your health care provider may recommend soaking the affected area in warm water a few times a day. When treatment is needed, the options may include:  Antibiotic medicine, if the condition is caused by a bacterial infection.  Antifungal medicine, if the condition is caused by a fungal infection.  Incision and drainage, if an abscess is present. In this procedure, the health care provider will cut open the abscess so the pus can drain out. HOME CARE INSTRUCTIONS  Soak the affected area in warm water if directed to do so by your health care provider. You may be told to do this for 20 minutes, 2-3 times a day. Keep the area dry in between soakings.  Take medicines only as directed by your health care provider.  If you were prescribed an antibiotic medicine, finish all of it even if you start to feel better.  Keep the affected area clean.  Do not try to drain a fluid-filled bump yourself.  If you will be washing dishes or performing other tasks that require your hands to get wet, wear rubber gloves. You should also wear gloves if your hands might come in contact with irritating substances, such as cleaners or chemicals.  Follow your health care provider's instructions about:  Wound care.  Bandage (dressing) changes and removal. SEEK MEDICAL CARE IF:  Your symptoms get worse or do not improve with treatment.  You have a fever or chills.  You have redness spreading from the affected area.  You have continued or increased fluid, blood, or pus coming  from the affected area.  Your finger or knuckle becomes swollen or is difficult to move.   This information is not intended to replace advice given to you by your health care provider. Make sure you discuss any questions you have with your health care provider.   Document Released: 05/31/2001 Document Revised: 04/21/2015 Document Reviewed: 11/12/2014 Elsevier Interactive Patient Education [employer]

## 2015-10-18 ENCOUNTER — Telehealth: Payer: Self-pay | Admitting: Emergency Medicine

## 2015-12-14 ENCOUNTER — Encounter: Payer: Self-pay | Admitting: Emergency Medicine

## 2015-12-14 ENCOUNTER — Emergency Department
Admission: EM | Admit: 2015-12-14 | Discharge: 2015-12-14 | Disposition: A | Discharge status: Home / Self Care | Payer: BLUE CROSS/BLUE SHIELD | Source: Home / Self Care | Attending: Family Medicine | Admitting: Family Medicine

## 2015-12-14 DIAGNOSIS — T7840XA Allergy, unspecified, initial encounter: Secondary | ICD-10-CM

## 2015-12-14 DIAGNOSIS — L298 Other pruritus: Secondary | ICD-10-CM | POA: Diagnosis not present

## 2015-12-14 MED ORDER — METHYLPREDNISOLONE SODIUM SUCC 40 MG IJ SOLR
80.0000 mg | Freq: Once | INTRAMUSCULAR | Status: AC
  Administered 2015-12-14: 80 mg via INTRAMUSCULAR

## 2015-12-14 MED ORDER — PREDNISONE 20 MG PO TABS: ORAL_TABLET | ORAL | Status: DC

## 2015-12-14 MED ORDER — HYDROXYZINE HCL 25 MG PO TABS: 25.0000 mg | ORAL_TABLET | Freq: Four times a day (QID) | ORAL | Status: DC

## 2015-12-14 NOTE — ED Notes (Signed)
Pt c/o allergic reaction to something, no new foods, hives spreading from hands up arms.  Took Benadryl, still itching x 1 day.

## 2015-12-14 NOTE — ED Provider Notes (Signed)
CSN: [id number]     Arrival date & time 12/14/15  1234 History   First MD Initiated Contact with Patient 12/14/15 1358     Chief Complaint  Patient presents with  . Allergic Reaction   (Consider location/radiation/quality/duration/timing/severity/associated sxs/prior Treatment) HPI Pt is a 44yo male presenting to [facility] with c/o moderately to severely pruritic erythematous rash that came on suddenly yesterday around 6PM and had gradually worsened with associated bilateral hand and forearm swelling. Rash is most pronounced on his hands but states it is spreading to his forearms and now his back.  He took 2 benadryl last night that helped him sleep but now pt is more concerned about swelling in his hands. He reports going to church yesterday and shaking hands with a stranger but states he washed his hands shortly after that. Cannot recall any new foods, medications, soaps or lotions.  Denies difficulty breathing or swallowing.  Only known allergy is benadryl.   Past Medical History  Diagnosis Date  . Anxiety   . Thyroid disease    Past Surgical History  Procedure Laterality Date  . Knee surgery    . Cholecystectomy    . Hip surgery Right    Family History  Problem Relation Age of Onset  . Heart failure Brother    Social History  Substance Use Topics  . Smoking status: Never Smoker   . Smokeless tobacco: Never Used  . Alcohol Use: No    Review of Systems  Constitutional: Negative for fever and chills.  HENT: Negative for congestion, ear pain, sore throat, trouble swallowing and voice change.   Respiratory: Negative for cough, chest tightness, shortness of breath, wheezing and stridor.   Cardiovascular: Negative for chest pain and palpitations.  Gastrointestinal: Negative for nausea, vomiting, abdominal pain and diarrhea.  Musculoskeletal: Positive for myalgias, joint swelling and arthralgias. Negative for back pain.       Bilateral hand swelling and pain  Skin: Positive for color  change and rash. Negative for wound.    Allergies  Amoxicillin  Home Medications   Prior to Admission medications   Medication Sig Start Date End Date Taking? Authorizing Provider  buPROPion (WELLBUTRIN) 75 MG tablet Take 25 mg by mouth 2 (two) times daily.    Historical Provider, MD  hydrOXYzine (ATARAX/VISTARIL) 25 MG tablet Take 1 tablet (25 mg total) by mouth every 6 (six) hours. 12/14/15   Junius FinnerErin O'Malley, PA-C  LEVOTHYROXINE SODIUM PO Take 0.25 mcg by mouth.    Historical Provider, MD  predniSONE (DELTASONE) 20 MG tablet 3 tabs po day one, then 2 po daily x 4 days 12/14/15   Junius FinnerErin O'Malley, PA-C   Meds Ordered and Administered this Visit   Medications  methylPREDNISolone sodium succinate (SOLU-MEDROL) 40 mg/mL injection 80 mg (80 mg Intramuscular Given 12/14/15 1424)    BP 140/99 mmHg  Pulse 94  Temp(Src) 98.4 F (36.9 C) (Oral)  Ht 6\' 2"  (1.88 m)  Wt 252 lb 12 oz (114.647 kg)  BMI 32.44 kg/m2  SpO2 98% No data found.   Physical Exam  Constitutional: He appears well-developed and well-nourished.  HENT:  Head: Normocephalic and atraumatic.  No oral swelling  Eyes: Conjunctivae are normal. No scleral icterus.  Neck: Normal range of motion. Neck supple.  Cardiovascular: Normal rate, regular rhythm and normal heart sounds.   Pulses:      Radial pulses are 2+ on the right side, and 2+ on the left side.  Pulmonary/Chest: Effort normal and breath sounds normal. No respiratory  distress. He has no wheezes. He has no rales. He exhibits no tenderness.  Abdominal: Soft. He exhibits no distension. There is no tenderness.  Musculoskeletal: Normal range of motion. He exhibits edema. He exhibits no tenderness.  Full ROM hands and arms. Mild to moderate edema of both hands and Left forearm.   Neurological: He is alert.  Skin: Skin is warm and dry. Rash noted. There is erythema.  Bilateral hands and forearms: erythematous diffuse maculopapular rash. No induration or fluctuance. No  bleeding or discharge.   Back: faint erythematous diffuse papular rash.  Nursing note and vitals reviewed.   ED Course  Procedures (including critical care time)  Labs Review Labs Reviewed - No data to display  Imaging Review No results found.    MDM   1. Allergic reaction, initial encounter   2. Pruritic erythematous rash    Pt presenting to [facility] with c/o erythematous pruritic rash on hands and forearms c/w allergic reaction but no known source. No evidence of underlying infection. No evidence of anaphylaxis at this time.   Tx in UC: Solumedrol  IM Rx: prednisone and hydroxyzine.   F/u with PCP or return to [facility] in 3-4 days if not improving, sooner if worsening. Discussed symptoms that warrant emergent care in the ED. Patient verbalized understanding and agreement with treatment plan.      Junius Finner, PA-C 12/14/15 [phone]

## 2015-12-27 ENCOUNTER — Encounter: Payer: Self-pay | Admitting: Emergency Medicine

## 2015-12-27 ENCOUNTER — Emergency Department
Admission: EM | Admit: 2015-12-27 | Discharge: 2015-12-27 | Disposition: A | Discharge status: Home / Self Care | Payer: BLUE CROSS/BLUE SHIELD | Source: Home / Self Care | Attending: Family Medicine | Admitting: Family Medicine

## 2015-12-27 ENCOUNTER — Emergency Department (INDEPENDENT_AMBULATORY_CARE_PROVIDER_SITE_OTHER): Payer: BLUE CROSS/BLUE SHIELD

## 2015-12-27 DIAGNOSIS — M25531 Pain in right wrist: Secondary | ICD-10-CM | POA: Diagnosis not present

## 2015-12-27 DIAGNOSIS — S63501A Unspecified sprain of right wrist, initial encounter: Secondary | ICD-10-CM | POA: Diagnosis not present

## 2015-12-27 DIAGNOSIS — M25541 Pain in joints of right hand: Secondary | ICD-10-CM | POA: Diagnosis not present

## 2015-12-27 MED ORDER — HYDROCODONE-ACETAMINOPHEN 5-325 MG PO TABS: ORAL_TABLET | ORAL | Status: DC

## 2015-12-27 NOTE — ED Notes (Signed)
Patient reports falling on ice this morning and landing on right arm; abraded elbow; now wrist and hand areas painful and edematous.

## 2015-12-27 NOTE — ED Provider Notes (Signed)
CSN: [id number]     Arrival date & time 12/27/15  1637 History   First MD Initiated Contact with Patient 12/27/15 1740     Chief Complaint  Patient presents with  . Wrist Pain      HPI Comments: Patient reports that he fell on ice this morning, landing on his right arm.  He now complains of pain in his right wrist and hand.  Patient is a 45 y.o. male presenting with wrist pain. The history is provided by the patient.  Wrist Pain This is a new problem. The current episode started 6 to 12 hours ago. The problem occurs constantly. The problem has not changed since onset.The symptoms are aggravated by bending. He has tried nothing for the symptoms.    Past Medical History  Diagnosis Date  . Anxiety   . Thyroid disease    Past Surgical History  Procedure Laterality Date  . Knee surgery    . Cholecystectomy    . Hip surgery Right    Family History  Problem Relation Age of Onset  . Heart failure Brother    Social History  Substance Use Topics  . Smoking status: Never Smoker   . Smokeless tobacco: Never Used  . Alcohol Use: No    Review of Systems  All other systems reviewed and are negative.   Allergies  Amoxicillin  Home Medications   Prior to Admission medications   Medication Sig Start Date End Date Taking? Authorizing Provider  buPROPion (WELLBUTRIN) 75 MG tablet Take 25 mg by mouth 2 (two) times daily.    Historical Provider, MD  HYDROcodone-acetaminophen (NORCO/VICODIN) 5-325 MG tablet Take one by mouth at bedtime as needed for pain 12/27/15   Lattie HawStephen A Daytona Hedman, MD  hydrOXYzine (ATARAX/VISTARIL) 25 MG tablet Take 1 tablet (25 mg total) by mouth every 6 (six) hours. 12/14/15   Junius FinnerErin O'Malley, PA-C  LEVOTHYROXINE SODIUM PO Take 0.25 mcg by mouth.    Historical Provider, MD   Meds Ordered and Administered this Visit  Medications - No data to display  BP 159/93 mmHg  Pulse 81  Temp(Src) 98 F (36.7 C) (Oral)  Resp 16  Ht 6\' 2"  (1.88 m)  Wt 250 lb (113.399 kg)  BMI  32.08 kg/m2  SpO2 98% No data found.   Physical Exam  Constitutional: He is oriented to person, place, and time. He appears well-developed and well-nourished. No distress.  HENT:  Head: Atraumatic.  Eyes: Conjunctivae are normal. Pupils are equal, round, and reactive to light.  Pulmonary/Chest: No respiratory distress.  Musculoskeletal:       Right wrist: He exhibits decreased range of motion, tenderness and bony tenderness. He exhibits no swelling, no effusion, no crepitus, no deformity and no laceration.       Right hand: He exhibits tenderness and bony tenderness. He exhibits normal range of motion, normal two-point discrimination, normal capillary refill, no deformity, no laceration and no swelling. Normal sensation noted. Normal strength noted.  Right hand has mild tenderness to palpation dorsally. Right wrist has mild decreased range of motion and tenderness to palpation over the distal ulnar stylus.  No snuffbox tenderness.  Distal neurovascular function is intact.   Neurological: He is alert and oriented to person, place, and time.  Skin: Skin is warm.  Nursing note and vitals reviewed.   ED Course  Procedures none    Imaging Review Dg Wrist Complete Right  12/27/2015  CLINICAL DATA:  Status post slip and fall on ice today with a  right wrist injury. Pain. Initial encounter. EXAM: RIGHT WRIST - COMPLETE 3+ VIEW COMPARISON:  None. FINDINGS: There is no evidence of fracture or dislocation. There is no evidence of arthropathy or other focal bone abnormality. Soft tissues are unremarkable. IMPRESSION: Negative exam. Electronically Signed   By: Drusilla Kanner M.D.   On: 12/27/2015 17:34   Dg Hand Complete Right  12/27/2015  CLINICAL DATA:  Slipped on ice.  Injury. EXAM: RIGHT HAND - COMPLETE 3+ VIEW COMPARISON:  None. FINDINGS: There is no evidence of fracture or dislocation. There is no evidence of arthropathy or other focal bone abnormality. Soft tissues are unremarkable.  IMPRESSION: Negative. Electronically Signed   By: Signa Kell M.D.   On: 12/27/2015 17:36      MDM   1. Right wrist sprain, initial encounter    Wrist splint applied.  Lortab at bedtime prn. Apply ice pack for 30 minutes every 1 to 2 hours today and tomorrow.  Elevate.  Wear brace for about 2 to 3 weeks.  Begin range of motion and stretching exercises in about 5 days as per instruction sheet.  May take Ibuprofen 200mg , 4 tabs every 8 hours with food.  Followup with Dr. Rodney Langton or Dr. Clementeen Graham ([facility]) if not improving about two weeks (concern for possible TFCC injury)    Lattie Haw, MD 12/29/15 1924

## 2015-12-27 NOTE — Discharge Instructions (Signed)
Apply ice pack for 30 minutes every 1 to 2 hours today and tomorrow.  Elevate.  Wear brace for about 2 to 3 weeks.  Begin range of motion and stretching exercises in about 5 days as per instruction sheet.  May take Ibuprofen 200mg , 4 tabs every 8 hours with food.

## 2017-07-13 ENCOUNTER — Emergency Department
Admission: EM | Admit: 2017-07-13 | Discharge: 2017-07-13 | Disposition: A | Discharge status: Home / Self Care | Payer: BLUE CROSS/BLUE SHIELD | Source: Home / Self Care | Attending: Family Medicine | Admitting: Family Medicine

## 2017-07-13 DIAGNOSIS — L03116 Cellulitis of left lower limb: Secondary | ICD-10-CM | POA: Diagnosis not present

## 2017-07-13 MED ORDER — HYDROCODONE-ACETAMINOPHEN 5-325 MG PO TABS: 1.0000 | ORAL_TABLET | Freq: Four times a day (QID) | ORAL | 0 refills | Status: DC | PRN

## 2017-07-13 MED ORDER — DOXYCYCLINE HYCLATE 100 MG PO CAPS: 100.0000 mg | ORAL_CAPSULE | Freq: Two times a day (BID) | ORAL | 0 refills | Status: DC

## 2017-07-13 NOTE — ED Triage Notes (Signed)
Pt noticed a bug bite either Sunday or Monday evening.  There was a large bump with a white head on the left ankle.  Had some clear bloody drainage when he scratched it.  Now the area burns and there is a red inch diameter circle around it.  Having stiffness in the ankle.

## 2017-07-13 NOTE — Discharge Instructions (Addendum)
Change dressing daily and apply Bacitracin ointment to wound.  Keep wound clean and dry.  Return for any signs of infection (or follow-up with family doctor):  Increasing redness, swelling, pain, heat, drainage, etc.  May take Ibuprofen 200mg , 4 tabs every 8 hours with food.  If symptoms become significantly worse during the night or over the weekend, proceed to the local emergency room.

## 2017-07-13 NOTE — ED Provider Notes (Signed)
[facility]    CSN: [id number] Arrival date & time: 07/13/17  1944     History   Chief Complaint Chief Complaint  Patient presents with  . Insect Bite    HPI Keith Wong is a 46 y.o. male.   Patient noticed an insect bite on his left anterior ankle 5 days ago.  He scratched the bite site, resulting in clear drainage.  The surrounding area has become increasingly red and sore, with stiffness in his ankle.  No fevers, chills, and sweats.   The history is provided by the patient.    Past Medical History:  Diagnosis Date  . Anxiety   . Thyroid disease     Patient Active Problem List   Diagnosis Date Noted  . OPEN WOUND FINGER WITHOUT MENTION COMPLICATION 09/25/2009  . LOW BACK PAIN, CHRONIC 08/07/2009    Past Surgical History:  Procedure Laterality Date  . CHOLECYSTECTOMY    . HIP SURGERY Right   . KNEE SURGERY         Home Medications    Prior to Admission medications   Medication Sig Start Date End Date Taking? Authorizing Provider  buPROPion (WELLBUTRIN) 75 MG tablet Take 25 mg by mouth 2 (two) times daily.    [provider]  doxycycline (VIBRAMYCIN) 100 MG capsule Take 1 capsule (100 mg total) by mouth 2 (two) times daily. Take with food. 07/13/17   Lattie HawBeese, Stephen A, MD  HYDROcodone-acetaminophen (NORCO/VICODIN) 5-325 MG tablet Take 1 tablet by mouth every 6 (six) hours as needed for moderate pain. 07/13/17   Lattie HawBeese, Stephen A, MD  hydrOXYzine (ATARAX/VISTARIL) 25 MG tablet Take 1 tablet (25 mg total) by mouth every 6 (six) hours. 12/14/15   Lurene ShadowPhelps, Erin O, PA-C  LEVOTHYROXINE SODIUM PO Take 0.25 mcg by mouth.    [provider]    Family History Family History  Problem Relation Age of Onset  . Heart failure Brother     Social History Social History  Substance Use Topics  . Smoking status: Former Games developermoker  . Smokeless tobacco: Never Used  . Alcohol use No     Allergies   Amoxicillin   Review of Systems Review of  Systems  Constitutional: Negative for activity change, appetite change, chills, diaphoresis, fatigue and fever.  HENT: Negative.   Eyes: Negative.   Respiratory: Negative.   Cardiovascular: Negative.   Gastrointestinal: Negative.   Genitourinary: Negative.   Musculoskeletal: Negative for joint swelling.  Skin: Positive for color change and wound.  Neurological: Negative for headaches.     Physical Exam Triage Vital Signs ED Triage Vitals  Enc Vitals Group     BP 07/13/17 2000 (!) 138/92     Pulse Rate 07/13/17 2000 78     Resp --      Temp 07/13/17 2000 98.2 F (36.8 C)     Temp Source 07/13/17 2000 Oral     SpO2 07/13/17 2000 97 %     Weight 07/13/17 2001 254 lb (115.2 kg)     Height 07/13/17 2001 6\' 2"  (1.88 m)     Head Circumference --      Peak Flow --      Pain Score --      Pain Loc --      Pain Edu? --      Excl. in GC? --    No data found.   Updated Vital Signs BP (!) 138/92 (BP Location: Left Arm)   Pulse 78   Temp  98.2 F (36.8 C) (Oral)   Ht 6\' 2"  (1.88 m)   Wt 254 lb (115.2 kg)   SpO2 97%   BMI 32.61 kg/m   Visual Acuity Right Eye Distance:   Left Eye Distance:   Bilateral Distance:    Right Eye Near:   Left Eye Near:    Bilateral Near:     Physical Exam  Constitutional: He appears well-developed and well-nourished. No distress.  HENT:  Head: Normocephalic.  Eyes: Pupils are equal, round, and reactive to light.  Neck: Normal range of motion.  Cardiovascular: Normal rate.   Pulmonary/Chest: Effort normal.  Musculoskeletal: He exhibits no edema.       Left ankle: He exhibits normal range of motion and no swelling.       Feet:  There is erythema, induration, and tenderness left anterior ankle as noted on diagram.  No fluctuance.  Neurological: He is alert.  Skin: Skin is warm and dry.  Nursing note and vitals reviewed.    UC Treatments / Results  Labs (all labs ordered are listed, but only abnormal results are displayed) Labs  Reviewed  WOUND CULTURE    EKG  EKG Interpretation None       Radiology No results found.  Procedures Procedures (including critical care time)  Medications Ordered in UC Medications - No data to display   Initial Impression / Assessment and Plan / UC Course  I have reviewed the triage vital signs and the nursing notes.  Pertinent labs & imaging results that were available during my care of the patient were reviewed by me and considered in my medical decision making (see chart for details).    Begin doxycycline for staph coverage.  Rx for Lortab. Controlled Substance Prescriptions I have consulted the [place] Controlled Substances Registry for this patient, and feel the risk/benefit ratio today is favorable for proceeding with this prescription for a controlled substance.   Change dressing daily and apply Bacitracin ointment to wound.  Keep wound clean and dry.  Return for any signs of infection (or follow-up with family doctor):  Increasing redness, swelling, pain, heat, drainage, etc.  May take Ibuprofen 200mg , 4 tabs every 8 hours with food.  If symptoms become significantly worse during the night or over the weekend, proceed to the local emergency room.      Final Clinical Impressions(s) / UC Diagnoses   Final diagnoses:  Cellulitis of left ankle    New Prescriptions New Prescriptions   DOXYCYCLINE (VIBRAMYCIN) 100 MG CAPSULE    Take 1 capsule (100 mg total) by mouth 2 (two) times daily. Take with food.   HYDROCODONE-ACETAMINOPHEN (NORCO/VICODIN) 5-325 MG TABLET    Take 1 tablet by mouth every 6 (six) hours as needed for moderate pain.     Lattie HawBeese, Stephen A, MD 08/10/17 1349

## 2017-07-15 ENCOUNTER — Telehealth: Payer: Self-pay | Admitting: Emergency Medicine

## 2017-07-17 LAB — WOUND CULTURE: Gram Stain: NONE SEEN

## 2017-11-12 ENCOUNTER — Other Ambulatory Visit: Payer: Self-pay

## 2017-11-12 ENCOUNTER — Encounter: Payer: Self-pay | Admitting: Emergency Medicine

## 2017-11-12 ENCOUNTER — Emergency Department
Admission: EM | Admit: 2017-11-12 | Discharge: 2017-11-12 | Disposition: A | Discharge status: Home / Self Care | Payer: BLUE CROSS/BLUE SHIELD | Source: Home / Self Care | Attending: Family Medicine | Admitting: Family Medicine

## 2017-11-12 DIAGNOSIS — J069 Acute upper respiratory infection, unspecified: Secondary | ICD-10-CM

## 2017-11-12 DIAGNOSIS — B9789 Other viral agents as the cause of diseases classified elsewhere: Secondary | ICD-10-CM | POA: Diagnosis not present

## 2017-11-12 DIAGNOSIS — J302 Other seasonal allergic rhinitis: Secondary | ICD-10-CM

## 2017-11-12 MED ORDER — METHYLPREDNISOLONE SODIUM SUCC 125 MG IJ SOLR
80.0000 mg | Freq: Once | INTRAMUSCULAR | Status: AC
  Administered 2017-11-12: 80 mg via INTRAMUSCULAR

## 2017-11-12 MED ORDER — BENZONATATE 200 MG PO CAPS: ORAL_CAPSULE | ORAL | 0 refills | Status: DC

## 2017-11-12 MED ORDER — PREDNISONE 20 MG PO TABS: ORAL_TABLET | ORAL | 0 refills | Status: DC

## 2017-11-12 MED ORDER — DOXYCYCLINE HYCLATE 100 MG PO CAPS: 100.0000 mg | ORAL_CAPSULE | Freq: Two times a day (BID) | ORAL | 0 refills | Status: DC

## 2017-11-12 NOTE — ED Triage Notes (Signed)
Sinus Pressure, Cough and Facial Pain x 4 days

## 2017-11-12 NOTE — Discharge Instructions (Signed)
Begin prednisone Monday 11/13/17. Take plain guaifenesin (1200mg  extended release tabs such as Mucinex) twice daily, with plenty of water, for cough and congestion.  May add Pseudoephedrine (30mg , one or two every 4 to 6 hours) for sinus congestion.  Get adequate rest.   May use Afrin nasal spray (or generic oxymetazoline) each morning for about 5 days and then discontinue.  Also recommend using saline nasal spray several times daily and saline nasal irrigation (AYR is a common brand).  Use Flonase nasal spray each morning after using Afrin nasal spray and saline nasal irrigation. Try warm salt water gargles for sore throat.  Stop all antihistamines for now, and other non-prescription cough/cold preparations.   Follow-up with family doctor if not improving about10 days.

## 2017-11-12 NOTE — ED Provider Notes (Signed)
[facility]    CSN: [id number] Arrival date & time: 11/12/17  1121     History   Chief Complaint Chief Complaint  Patient presents with  . Facial Pain    HPI Keith Wong is a 46 y.o. male.   Patient complains of five day history of typical cold-like symptoms developing over several days, including mild sore throat, sinus congestion, headache, fatigue, myalgias, chills, and cough.  His ears feel clogged and today he has felt shortness of breath. He has seasonal rhinitis and a deviated septum and has frequent sinusitis.   The history is provided by the patient.    Past Medical History:  Diagnosis Date  . Anxiety   . Thyroid disease     Patient Active Problem List   Diagnosis Date Noted  . OPEN WOUND FINGER WITHOUT MENTION COMPLICATION 09/25/2009  . LOW BACK PAIN, CHRONIC 08/07/2009    Past Surgical History:  Procedure Laterality Date  . CHOLECYSTECTOMY    . HIP SURGERY Right   . KNEE SURGERY         Home Medications    Prior to Admission medications   Medication Sig Start Date End Date Taking? Authorizing Provider  benzonatate (TESSALON) 200 MG capsule Take one cap by mouth at bedtime as needed for cough.  May repeat in 4 to 6 hours 11/12/17   Lattie Haw, MD  buPROPion [facility]) 75 MG tablet Take 25 mg by mouth 2 (two) times daily.    [provider]  doxycycline (VIBRAMYCIN) 100 MG capsule Take 1 capsule (100 mg total) by mouth 2 (two) times daily. Take with food. 11/12/17   Lattie Haw, MD  HYDROcodone-acetaminophen (NORCO/VICODIN) 5-325 MG tablet Take 1 tablet by mouth every 6 (six) hours as needed for moderate pain. 07/13/17   Lattie Haw, MD  hydrOXYzine (ATARAX/VISTARIL) 25 MG tablet Take 1 tablet (25 mg total) by mouth every 6 (six) hours. 12/14/15   Lurene Shadow, PA-C  LEVOTHYROXINE SODIUM PO Take 0.25 mcg by mouth.    [provider]  predniSONE (DELTASONE) 20 MG tablet Take one tab by mouth twice  daily for 4 days, then one daily for 3 days. Take with food. 11/12/17   Lattie Haw, MD    Family History Family History  Problem Relation Age of Onset  . Heart failure Brother     Social History Social History   Tobacco Use  . Smoking status: Former Games developer  . Smokeless tobacco: Never Used  Substance Use Topics  . Alcohol use: No  . Drug use: No     Allergies   Amoxicillin   Review of Systems Review of Systems + sore throat + cough No pleuritic pain No wheezing + nasal congestion + post-nasal drainage + sinus pain/pressure No itchy/red eyes ? earache No hemoptysis + SOB today No fever, + chills No nausea No vomiting No abdominal pain No diarrhea No urinary symptoms No skin rash + fatigue + myalgias + headache Used OTC meds without relief   Physical Exam Triage Vital Signs ED Triage Vitals  Enc Vitals Group     BP 11/12/17 1147 138/83     Pulse Rate 11/12/17 1147 81     Resp 11/12/17 1147 16     Temp 11/12/17 1147 98.5 F (36.9 C)     Temp Source 11/12/17 1147 Oral     SpO2 11/12/17 1147 99 %     Weight 11/12/17 1148 255 lb (115.7 kg)  Height 11/12/17 1148 6\' 2"  (1.88 m)     Head Circumference --      Peak Flow --      Pain Score 11/12/17 1148 2     Pain Loc --      Pain Edu? --      Excl. in GC? --    No data found.  Updated Vital Signs BP 138/83 (BP Location: Left Arm)   Pulse 81   Temp 98.5 F (36.9 C) (Oral)   Resp 16   Ht 6\' 2"  (1.88 m)   Wt 255 lb (115.7 kg)   SpO2 99%   BMI 32.74 kg/m   Visual Acuity Right Eye Distance:   Left Eye Distance:   Bilateral Distance:    Right Eye Near:   Left Eye Near:    Bilateral Near:     Physical Exam Nursing notes and Vital Signs reviewed. Appearance:  Patient appears stated age, and in no acute distress Eyes:  Pupils are equal, round, and reactive to light and accomodation.  Extraocular movement is intact.  Conjunctivae are not inflamed  Ears:  Canals normal.  Tympanic  membranes normal.  Nose:  Congested turbinates.  Maxillary sinus tenderness is present.  Pharynx:  Normal Neck:  Supple.  Enlarged posterior/lateral nodes are palpated bilaterally, tender to palpation on the left.   Lungs:  Clear to auscultation.  Breath sounds are equal.  Moving air well. Heart:  Regular rate and rhythm without murmurs, rubs, or gallops.  Abdomen:  Nontender without masses or hepatosplenomegaly.  Bowel sounds are present.  No CVA or flank tenderness.  Extremities:  No edema.  Skin:  No rash present.    UC Treatments / Results  Labs (all labs ordered are listed, but only abnormal results are displayed) Labs Reviewed - No data to display  EKG  EKG Interpretation None       Radiology No results found.  Procedures Procedures (including critical care time)  Medications Ordered in UC Medications  methylPREDNISolone sodium succinate (SOLU-MEDROL) 125 mg/2 mL injection 80 mg (not administered)     Initial Impression / Assessment and Plan / UC Course  I have reviewed the triage vital signs and the nursing notes.  Pertinent labs & imaging results that were available during my care of the patient were reviewed by me and considered in my medical decision making (see chart for details).    Because of his history of seasonal rhinitis, recurrent sinusitis, and deviated septum, will begin empiric doxycycline. Administered Solumedrol 80mg  IM Prescription written for Benzonatate (Tessalon) to take at bedtime for night-time cough.  Begin prednisone Monday 11/13/17. Take plain guaifenesin (1200mg  extended release tabs such as Mucinex) twice daily, with plenty of water, for cough and congestion.  May add Pseudoephedrine (30mg , one or two every 4 to 6 hours) for sinus congestion.  Get adequate rest.   May use Afrin nasal spray (or generic oxymetazoline) each morning for about 5 days and then discontinue.  Also recommend using saline nasal spray several times daily and saline  nasal irrigation (AYR is a common brand).  Use Flonase nasal spray each morning after using Afrin nasal spray and saline nasal irrigation. Try warm salt water gargles for sore throat.  Stop all antihistamines for now, and other non-prescription cough/cold preparations.   Follow-up with family doctor if not improving about10 days.     Final Clinical Impressions(s) / UC Diagnoses   Final diagnoses:  Viral URI with cough  Seasonal allergic rhinitis, unspecified trigger  ED Discharge Orders        Ordered    doxycycline (VIBRAMYCIN) 100 MG capsule  2 times daily     11/12/17 1242    predniSONE (DELTASONE) 20 MG tablet     11/12/17 1242    benzonatate (TESSALON) 200 MG capsule     11/12/17 1242          Lattie HawBeese, Latrail Pounders A, MD 11/13/17 1438

## 2017-11-13 ENCOUNTER — Telehealth: Payer: Self-pay

## 2019-05-18 ENCOUNTER — Emergency Department
Admission: EM | Admit: 2019-05-18 | Discharge: 2019-05-18 | Disposition: A | Discharge status: Home / Self Care | Payer: BLUE CROSS/BLUE SHIELD | Source: Home / Self Care

## 2019-05-18 ENCOUNTER — Encounter: Payer: Self-pay | Admitting: Emergency Medicine

## 2019-05-18 ENCOUNTER — Other Ambulatory Visit: Payer: Self-pay

## 2019-05-18 DIAGNOSIS — L03011 Cellulitis of right finger: Secondary | ICD-10-CM | POA: Diagnosis not present

## 2019-05-18 MED ORDER — MUPIROCIN 2 % EX OINT: TOPICAL_OINTMENT | CUTANEOUS | 0 refills | Status: DC

## 2019-05-18 MED ORDER — CEPHALEXIN 500 MG PO CAPS: 500.0000 mg | ORAL_CAPSULE | Freq: Two times a day (BID) | ORAL | 0 refills | Status: DC

## 2019-05-18 NOTE — ED Triage Notes (Signed)
Patient reports pain and redness around middle right finger nail bed over past 3-5 days; very painful. He has not travelled past 4 weeks.

## 2019-05-18 NOTE — ED Provider Notes (Signed)
[name]    CSN: [id number] Arrival date & time: 05/18/19  1405     History   Chief Complaint Chief Complaint  Patient presents with  . Hand Pain    HPI Keith Wong is a 48 y.o. male.   HPI Keith Wong is a 48 y.o. male presenting to UC with c/o 4-5 days of worsening Right middle finger pain, swelling and redness. No known injury. He tried soaking his finger in warm water and Epson salt for 1 hour last night w/o relief.  Pain is throbbing, 6/10. He is Right hand dominant.    Past Medical History:  Diagnosis Date  . Anxiety   . Thyroid disease     Patient Active Problem List   Diagnosis Date Noted  . OPEN WOUND FINGER WITHOUT MENTION COMPLICATION 09/25/2009  . LOW BACK PAIN, CHRONIC 08/07/2009    Past Surgical History:  Procedure Laterality Date  . CHOLECYSTECTOMY    . HIP SURGERY Right   . KNEE SURGERY         Home Medications    Prior to Admission medications   Medication Sig Start Date End Date Taking? Authorizing Provider  cetirizine (ZYRTEC) 10 MG tablet Take 10 mg by mouth daily.   Yes [provider]  benzonatate (TESSALON) 200 MG capsule Take one cap by mouth at bedtime as needed for cough.  May repeat in 4 to 6 hours 11/12/17   Lattie Haw, MD  buPROPion [facility]) 75 MG tablet Take 25 mg by mouth 2 (two) times daily.    [provider]  cephALEXin (KEFLEX) 500 MG capsule Take 1 capsule (500 mg total) by mouth 2 (two) times daily. 05/18/19   Lurene Shadow, PA-C  doxycycline (VIBRAMYCIN) 100 MG capsule Take 1 capsule (100 mg total) by mouth 2 (two) times daily. Take with food. 11/12/17   Lattie Haw, MD  HYDROcodone-acetaminophen (NORCO/VICODIN) 5-325 MG tablet Take 1 tablet by mouth every 6 (six) hours as needed for moderate pain. 07/13/17   Lattie Haw, MD  hydrOXYzine (ATARAX/VISTARIL) 25 MG tablet Take 1 tablet (25 mg total) by mouth every 6 (six) hours. 12/14/15   Lurene Shadow, PA-C   LEVOTHYROXINE SODIUM PO Take 0.25 mcg by mouth.    [provider]  mupirocin ointment (BACTROBAN) 2 % Apply to wound 3 times daily for 5 days 05/18/19   Lurene Shadow, PA-C  predniSONE (DELTASONE) 20 MG tablet Take one tab by mouth twice daily for 4 days, then one daily for 3 days. Take with food. 11/12/17   Lattie Haw, MD    Family History Family History  Problem Relation Age of Onset  . Heart failure Brother     Social History Social History   Tobacco Use  . Smoking status: Former Games developer  . Smokeless tobacco: Never Used  Substance Use Topics  . Alcohol use: No  . Drug use: No     Allergies   Amoxicillin   Review of Systems Review of Systems  Musculoskeletal: Negative for arthralgias and myalgias.  Skin: Positive for color change. Negative for wound.  Neurological: Negative for numbness.     Physical Exam Triage Vital Signs ED Triage Vitals  Enc Vitals Group     BP      Pulse      Resp      Temp      Temp src      SpO2      Weight  Height      Head Circumference      Peak Flow      Pain Score      Pain Loc      Pain Edu?      Excl. in GC?    No data found.  Updated Vital Signs BP 121/79 (BP Location: Left Arm)   Pulse 84   Temp 98.6 F (37 C) (Oral)   Resp 16   Ht 6\' 2"  (1.88 m)   Wt 260 lb (117.9 kg)   SpO2 99%   BMI 33.38 kg/m   Visual Acuity Right Eye Distance:   Left Eye Distance:   Bilateral Distance:    Right Eye Near:   Left Eye Near:    Bilateral Near:     Physical Exam Vitals signs and nursing note reviewed.  Constitutional:      Appearance: Normal appearance. He is well-developed.  HENT:     Head: Normocephalic and atraumatic.  Neck:     Musculoskeletal: Normal range of motion.  Cardiovascular:     Rate and Rhythm: Normal rate.  Pulmonary:     Effort: Pulmonary effort is normal.  Musculoskeletal: Normal range of motion.     Comments: Right middle finger: full ROM  Skin:    General: Skin is warm  and dry.     Capillary Refill: Capillary refill takes less than 2 seconds.     Comments: Right middle finger: mild edema, erythema, and tenderness at base of nailbed. Skin in tact. Mild fluctuance. No drainage.   Neurological:     Mental Status: He is alert and oriented to person, place, and time.  Psychiatric:        Behavior: Behavior normal.      UC Treatments / Results  Labs (all labs ordered are listed, but only abnormal results are displayed) Labs Reviewed - No data to display  EKG None  Radiology No results found.  Procedures Incision and Drainage Date/Time: 05/18/2019 2:53 PM Performed by: Lurene ShadowPhelps, Ausha Sieh O, PA-C Authorized by: Lurene ShadowPhelps, Reganne Messerschmidt O, PA-C   Consent:    Consent obtained:  Verbal   Consent given by:  Patient   Risks discussed:  Bleeding, incomplete drainage, pain and infection   Alternatives discussed:  No treatment and delayed treatment Location:    Type:  Abscess   Size:  1cm   Location:  Upper extremity   Upper extremity location:  Finger   Finger location:  R long finger Pre-procedure details:    Skin preparation:  Betadine Anesthesia (see MAR for exact dosages):    Anesthesia method:  Local infiltration   Local anesthetic:  Lidocaine 1% w/o epi Procedure type:    Complexity:  Simple Procedure details:    Needle aspiration: no     Incision types:  Single straight   Incision depth:  Subcutaneous   Scalpel blade:  11   Wound management:  Probed and deloculated   Drainage:  Bloody and purulent   Drainage amount:  Scant   Wound treatment:  Wound left open   Packing materials:  None Post-procedure details:    Patient tolerance of procedure:  Tolerated well, no immediate complications   (including critical care time)  Medications Ordered in UC Medications - No data to display  Initial Impression / Assessment and Plan / UC Course  I have reviewed the triage vital signs and the nursing notes.  Pertinent labs & imaging results that were  available during my care of the patient were reviewed  by me and considered in my medical decision making (see chart for details).     Hx and exam c/w paronychia  I&D performed. Will also tx with mupirocin ointment and keflex given duration of symptoms and remaining edema/induration after I&D Home care info provided.  Final Clinical Impressions(s) / UC Diagnoses   Final diagnoses:  Paronychia of right middle finger     Discharge Instructions      Keep wound clean with warm water and mild soap. You may soak your finger in warm water and Epson salt 2-3 times daily for 15-20 minutes at a time.  You may take  acetaminophen every 4-6 hours or in combination with ibuprofen 400-600mg  every 6-8 hours as needed for pain and inflammation.  Please follow up with your family doctor or return to urgent care in 4-5 days if not improving, sooner if significantly worsening.    ED Prescriptions    Medication Sig Dispense Auth. Provider   cephALEXin (KEFLEX) 500 MG capsule Take 1 capsule (500 mg total) by mouth 2 (two) times daily. 14 capsule Doroteo Glassman, Lilly Gasser O, PA-C   mupirocin ointment (BACTROBAN) 2 % Apply to wound 3 times daily for 5 days 22 g Lurene Shadow, [place]     Controlled Substance Prescriptions [place] Controlled Substance Registry consulted? Not Applicable   Lurene Shadow, PA-C 05/18/19 1455

## 2019-05-18 NOTE — Discharge Instructions (Signed)
°  Keep wound clean with warm water and mild soap. You may soak your finger in warm water and Epson salt 2-3 times daily for 15-20 minutes at a time.  You may take 500mg  acetaminophen every 4-6 hours or in combination with ibuprofen 400-600mg  every 6-8 hours as needed for pain and inflammation.  Please follow up with your family doctor or return to urgent care in 4-5 days if not improving, sooner if significantly worsening.

## 2019-06-21 ENCOUNTER — Other Ambulatory Visit: Payer: Self-pay

## 2019-06-21 ENCOUNTER — Emergency Department
Admission: EM | Admit: 2019-06-21 | Discharge: 2019-06-21 | Disposition: A | Discharge status: Home / Self Care | Payer: BC Managed Care – PPO | Source: Home / Self Care | Attending: Family Medicine | Admitting: Family Medicine

## 2019-06-21 DIAGNOSIS — Z23 Encounter for immunization: Secondary | ICD-10-CM

## 2019-06-21 DIAGNOSIS — S61211A Laceration without foreign body of left index finger without damage to nail, initial encounter: Secondary | ICD-10-CM

## 2019-06-21 MED ORDER — TETANUS-DIPHTH-ACELL PERTUSSIS 5-2.5-18.5 LF-MCG/0.5 IM SUSP
0.5000 mL | Freq: Once | INTRAMUSCULAR | Status: AC
  Administered 2019-06-21: 0.5 mL via INTRAMUSCULAR

## 2019-06-21 NOTE — ED Triage Notes (Signed)
Working on [employer] about an hour ago, and blades cut left middle and index finger.

## 2019-06-21 NOTE — Discharge Instructions (Signed)
Change dressing daily and apply Bacitracin ointment to wound.  Keep wound clean and dry.  Return for any signs of infection (or follow-up with family doctor):  Increasing redness, swelling, pain, heat, drainage, etc. °Return in 10 days for suture removal.   °

## 2019-06-21 NOTE — ED Provider Notes (Signed)
[facility]    CSN: [id number] Arrival date & time: 06/21/19  1106     History   Chief Complaint Chief Complaint  Patient presents with  . Laceration    HPI Keith Wong is a 48 y.o. male.   Patient lacerated his left third finger about 2 hours ago while installing an attic fan.  He is not sure of his last Tdap.  The history is provided by the patient.  Laceration Location:  Finger Finger laceration location:  L middle finger Length:  1.5cm and 1cm Depth:  Through dermis Quality comment:  Distal flap laceration and proximal linear laceration Bleeding: controlled   Time since incident:  2 hours Laceration mechanism:  Metal edge Pain details:    Quality:  Dull   Severity:  Mild   Timing:  Constant   Progression:  Improving Foreign body present:  No foreign bodies Relieved by:  Nothing Worsened by:  Nothing Ineffective treatments:  None tried Tetanus status:  Out of date Associated symptoms: no focal weakness and no swelling     Past Medical History:  Diagnosis Date  . Anxiety   . Thyroid disease     Patient Active Problem List   Diagnosis Date Noted  . OPEN WOUND FINGER WITHOUT MENTION COMPLICATION 09/25/2009  . LOW BACK PAIN, CHRONIC 08/07/2009    Past Surgical History:  Procedure Laterality Date  . CHOLECYSTECTOMY    . HIP SURGERY Right   . KNEE SURGERY         Home Medications    Prior to Admission medications   Medication Sig Start Date End Date Taking? Authorizing Provider  benzonatate (TESSALON) 200 MG capsule Take one cap by mouth at bedtime as needed for cough.  May repeat in 4 to 6 hours 11/12/17   Lattie HawBeese, Mozel Burdett A, MD  buPROPion [facility](WELLBUTRIN) 75 MG tablet Take 25 mg by mouth 2 (two) times daily.    [provider]  cephALEXin (KEFLEX) 500 MG capsule Take 1 capsule (500 mg total) by mouth 2 (two) times daily. 05/18/19   Lurene ShadowPhelps, Erin O, PA-C  cetirizine (ZYRTEC) 10 MG tablet Take 10 mg by mouth daily.    [provider]  doxycycline (VIBRAMYCIN) 100 MG capsule Take 1 capsule (100 mg total) by mouth 2 (two) times daily. Take with food. 11/12/17   Lattie HawBeese, Rand Boller A, MD  HYDROcodone-acetaminophen (NORCO/VICODIN) 5-325 MG tablet Take 1 tablet by mouth every 6 (six) hours as needed for moderate pain. 07/13/17   Lattie HawBeese, Roberth Berling A, MD  hydrOXYzine (ATARAX/VISTARIL) 25 MG tablet Take 1 tablet (25 mg total) by mouth every 6 (six) hours. 12/14/15   Lurene ShadowPhelps, Erin O, PA-C  LEVOTHYROXINE SODIUM PO Take 0.25 mcg by mouth.    [provider]  mupirocin ointment (BACTROBAN) 2 % Apply to wound 3 times daily for 5 days 05/18/19   Lurene ShadowPhelps, Erin O, PA-C  predniSONE (DELTASONE) 20 MG tablet Take one tab by mouth twice daily for 4 days, then one daily for 3 days. Take with food. 11/12/17   Lattie HawBeese, Nyonna Hargrove A, MD    Family History Family History  Problem Relation Age of Onset  . Heart failure Brother     Social History Social History   Tobacco Use  . Smoking status: Former Games developermoker  . Smokeless tobacco: Never Used  Substance Use Topics  . Alcohol use: No  . Drug use: No     Allergies   Amoxicillin   Review of Systems Review of Systems  Neurological: Negative for focal weakness.  All other systems reviewed and are negative.    Physical Exam Triage Vital Signs ED Triage Vitals  Enc Vitals Group     BP 06/21/19 1135 131/86     Pulse Rate 06/21/19 1135 96     Resp 06/21/19 1135 20     Temp 06/21/19 1135 98.2 F (36.8 C)     Temp Source 06/21/19 1135 Oral     SpO2 06/21/19 1135 96 %     Weight 06/21/19 1136 258 lb (117 kg)     Height 06/21/19 1136 6\' 2"  (1.88 m)     Head Circumference --      Peak Flow --      Pain Score 06/21/19 1136 1     Pain Loc --      Pain Edu? --      Excl. in GC? --    No data found.  Updated Vital Signs BP 131/86 (BP Location: Right Arm)   Pulse 96   Temp 98.2 F (36.8 C) (Oral)   Resp 20   Ht 6\' 2"  (1.88 m)   Wt 117 kg   SpO2 96%   BMI 33.13 kg/m    Visual Acuity Right Eye Distance:   Left Eye Distance:   Bilateral Distance:    Right Eye Near:   Left Eye Near:    Bilateral Near:     Physical Exam Vitals signs and nursing note reviewed.  Constitutional:      General: He is not in acute distress. HENT:     Head: Atraumatic.     Nose: Nose normal.     Mouth/Throat:     Pharynx: Oropharynx is clear.  Eyes:     Conjunctiva/sclera: Conjunctivae normal.     Pupils: Pupils are equal, round, and reactive to light.  Neck:     Musculoskeletal: Normal range of motion.  Cardiovascular:     Rate and Rhythm: Normal rate.  Pulmonary:     Effort: Pulmonary effort is normal.  Musculoskeletal:        General: Signs of injury present.       Hands:     Comments: Left 3rd finger has a 1.5cm L-shaped flap laceration on the dorsum of the distal phalanx.  The middle phalanx has a 1cm superficial laceration as noted on diagram.  Finger has full range of motion all joints.  Distal neurovascular function is intact.   Skin:    General: Skin is warm and dry.  Neurological:     Mental Status: He is alert.      UC Treatments / Results  Labs (all labs ordered are listed, but only abnormal results are displayed) Labs Reviewed - No data to display  EKG   Radiology No results found.  Procedures Procedures Laceration Repair Discussed benefits and risks of procedure and verbal consent obtained. Using sterile technique and digital anesthesia with 2% lidocaine without epinephrine, cleansed wounds with Betadine followed by copious lavage with normal saline.  Wounds carefully inspected for debris and foreign bodies; none found.  Distal wound closed with # 4, 4-0 interrupted nylon sutures.  Proximal wound closed with #2, 4-0 nylon sutures.   Bacitracin and non-stick sterile dressing applied.  Wound precautions explained to patient.  Return for suture removal in 10 days.   Medications Ordered in UC Medications  Tdap (BOOSTRIX) injection 0.5 mL  (0.5 mLs Intramuscular Given 06/21/19 1221)    Initial Impression / Assessment and Plan / UC Course  I have reviewed the triage vital signs and the nursing notes.  Pertinent labs & imaging results that were available during my care of the patient were reviewed by me and considered in my medical decision making (see chart for details).    Administered Tdap     Final Clinical Impressions(s) / UC Diagnoses   Final diagnoses:  Laceration of left index finger without foreign body without damage to nail, initial encounter     Discharge Instructions     Change dressing daily and apply Bacitracin ointment to wound.  Keep wound clean and dry.  Return for any signs of infection (or follow-up with family doctor):  Increasing redness, swelling, pain, heat, drainage, etc. Return in 10 days for suture removal.      ED Prescriptions    None        Kandra Nicolas, MD 06/24/19 2147

## 2020-11-28 ENCOUNTER — Emergency Department (INDEPENDENT_AMBULATORY_CARE_PROVIDER_SITE_OTHER): Payer: BC Managed Care – PPO

## 2020-11-28 ENCOUNTER — Emergency Department: Payer: BC Managed Care – PPO

## 2020-11-28 ENCOUNTER — Emergency Department
Admission: EM | Admit: 2020-11-28 | Discharge: 2020-11-28 | Disposition: A | Discharge status: Home / Self Care | Payer: BC Managed Care – PPO | Source: Home / Self Care

## 2020-11-28 ENCOUNTER — Other Ambulatory Visit: Payer: Self-pay

## 2020-11-28 ENCOUNTER — Encounter: Payer: Self-pay | Admitting: Emergency Medicine

## 2020-11-28 DIAGNOSIS — M25571 Pain in right ankle and joints of right foot: Secondary | ICD-10-CM | POA: Diagnosis not present

## 2020-11-28 DIAGNOSIS — W19XXXA Unspecified fall, initial encounter: Secondary | ICD-10-CM

## 2020-11-28 DIAGNOSIS — S93401A Sprain of unspecified ligament of right ankle, initial encounter: Secondary | ICD-10-CM

## 2020-11-28 HISTORY — DX: COVID-19: U07.1

## 2020-11-28 MED ORDER — TRAMADOL HCL 50 MG PO TABS: 50.0000 mg | ORAL_TABLET | Freq: Four times a day (QID) | ORAL | 0 refills | Status: DC | PRN

## 2020-11-28 NOTE — ED Triage Notes (Signed)
Patient fell off ladder 8 days ago and rolled right ankle; soreness and edema have persisted. Had covid 8 months ago; was not hopsitalized.

## 2020-11-28 NOTE — ED Provider Notes (Signed)
[facility]    CSN: [id number] Arrival date & time: 11/28/20  1328      History   Chief Complaint Chief Complaint  Patient presents with  . Ankle Pain    HPI Keith Wong is a 49 y.o. male.   HPI  Keith Wong is a 49 y.o. male presenting to UC with c/o Right ankle pain that started 8 days ago after falling off a ladder. Pain is aching and sore, 7/10, worse with ambulation. Associated swelling.    Past Medical History:  Diagnosis Date  . Anxiety   . COVID    4/21  . Thyroid disease     Patient Active Problem List   Diagnosis Date Noted  . OPEN WOUND FINGER WITHOUT MENTION COMPLICATION 09/25/2009  . LOW BACK PAIN, CHRONIC 08/07/2009    Past Surgical History:  Procedure Laterality Date  . CHOLECYSTECTOMY    . HIP SURGERY Right   . KNEE SURGERY         Home Medications    Prior to Admission medications   Medication Sig Start Date End Date Taking? Authorizing Provider  benzonatate (TESSALON) 200 MG capsule Take one cap by mouth at bedtime as needed for cough.  May repeat in 4 to 6 hours 11/12/17   Lattie Haw, MD  buPROPion [facility]) 75 MG tablet Take 25 mg by mouth 2 (two) times daily.    [provider]  cephALEXin (KEFLEX) 500 MG capsule Take 1 capsule (500 mg total) by mouth 2 (two) times daily. 05/18/19   Lurene Shadow, PA-C  cetirizine (ZYRTEC) 10 MG tablet Take 10 mg by mouth daily.    [provider]  doxycycline (VIBRAMYCIN) 100 MG capsule Take 1 capsule (100 mg total) by mouth 2 (two) times daily. Take with food. 11/12/17   Lattie Haw, MD  hydrOXYzine (ATARAX/VISTARIL) 25 MG tablet Take 1 tablet (25 mg total) by mouth every 6 (six) hours. 12/14/15   Lurene Shadow, PA-C  LEVOTHYROXINE SODIUM PO Take 0.25 mcg by mouth.    [provider]  mupirocin ointment (BACTROBAN) 2 % Apply to wound 3 times daily for 5 days 05/18/19   Lurene Shadow, PA-C  predniSONE (DELTASONE) 20 MG tablet Take one tab by  mouth twice daily for 4 days, then one daily for 3 days. Take with food. 11/12/17   Lattie Haw, MD  traMADol (ULTRAM) 50 MG tablet Take 1 tablet (50 mg total) by mouth every 6 (six) hours as needed. 11/28/20   Lurene Shadow, PA-C    Family History Family History  Problem Relation Age of Onset  . Heart failure Brother     Social History Social History   Tobacco Use  . Smoking status: Former Games developer  . Smokeless tobacco: Never Used  Substance Use Topics  . Alcohol use: No  . Drug use: No     Allergies   Amoxicillin   Review of Systems Review of Systems  Musculoskeletal: Positive for arthralgias, joint swelling and myalgias.  Skin: Negative for color change and wound.     Physical Exam Triage Vital Signs ED Triage Vitals  Enc Vitals Group     BP 11/28/20 1347 136/89     Pulse Rate 11/28/20 1347 83     Resp 11/28/20 1347 16     Temp --      Temp Source 11/28/20 1347 Oral     SpO2 11/28/20 1347 98 %     Weight 11/28/20 1348  250 lb (113.4 kg)     Height 11/28/20 1348 6\' 2"  (1.88 m)     Head Circumference --      Peak Flow --      Pain Score 11/28/20 1348 7     Pain Loc --      Pain Edu? --      Excl. in GC? --    No data found.  Updated Vital Signs BP 136/89 (BP Location: Right Arm)   Pulse 83   Resp 16   Ht 6\' 2"  (1.88 m)   Wt 250 lb (113.4 kg)   SpO2 98%   BMI 32.10 kg/m   Visual Acuity Right Eye Distance:   Left Eye Distance:   Bilateral Distance:    Right Eye Near:   Left Eye Near:    Bilateral Near:     Physical Exam Vitals and nursing note reviewed.  Constitutional:      Appearance: Normal appearance. He is well-developed and well-nourished.  HENT:     Head: Normocephalic and atraumatic.  Eyes:     Extraocular Movements: EOM normal.  Cardiovascular:     Rate and Rhythm: Normal rate and regular rhythm.  Pulmonary:     Effort: Pulmonary effort is normal.     Breath sounds: Normal breath sounds.  Musculoskeletal:         General: Swelling and tenderness present. Normal range of motion.     Cervical back: Normal range of motion.     Comments: Right ankle: moderate edema. Tenderness to lateral aspect. Full ROM, increased pain end range.   Skin:    General: Skin is warm and dry.     Capillary Refill: Capillary refill takes less than 2 seconds.     Findings: No bruising or erythema.  Neurological:     Mental Status: He is alert and oriented to person, place, and time.  Psychiatric:        Mood and Affect: Mood and affect normal.        Behavior: Behavior normal.      UC Treatments / Results  Labs (all labs ordered are listed, but only abnormal results are displayed) Labs Reviewed - No data to display  EKG   Radiology DG Ankle Complete Right  Result Date: 11/28/2020 CLINICAL DATA:  Right ankle pain after fall from ladder last week. EXAM: RIGHT ANKLE - COMPLETE 3+ VIEW COMPARISON:  None. FINDINGS: There is no evidence of fracture, dislocation, or joint effusion. There is no evidence of arthropathy or other focal bone abnormality. Soft tissue swelling is seen over lateral malleolus. IMPRESSION: No fracture or dislocation is noted. Soft tissue swelling is seen over lateral malleolus. Electronically Signed   By: M.D.   On: 11/28/2020 14:40    Procedures Procedures (including critical care time)  Medications Ordered in UC Medications - No data to display  Initial Impression / Assessment and Plan / UC Course  I have reviewed the triage vital signs and the nursing notes.  Pertinent labs & imaging results that were available during my care of the patient were reviewed by me and considered in my medical decision making (see chart for details).    Reviewed imaging with pt  Offered ankle brace, made pain worse. Ace wrap applied for comfort Pt has crutches at home AVS given  Final Clinical Impressions(s) / UC Diagnoses   Final diagnoses:  Moderate right ankle sprain, initial  encounter     Discharge Instructions  You may use the ankle splint for extra support and protection as your ankle heals over the next several weeks. You can also use crutches as needed.  You may take 500mg  acetaminophen every 4-6 hours or in combination with ibuprofen 400-600mg  every 6-8 hours as needed for pain and inflammation.   Call to schedule a follow up with Sports Medicine or Orthopedist in 1-2 weeks for ongoing care.      ED Prescriptions    Medication Sig Dispense Auth. Provider   traMADol (ULTRAM) 50 MG tablet Take 1 tablet (50 mg total) by mouth every 6 (six) hours as needed. 15 tablet , Lurene Shadow     I have reviewed the PDMP during this encounter.   [place], Lurene Shadow 11/29/20 [id number]

## 2020-11-28 NOTE — Discharge Instructions (Signed)
  You may use the ankle splint for extra support and protection as your ankle heals over the next several weeks. You can also use crutches as needed.  You may take 500mg  acetaminophen every 4-6 hours or in combination with ibuprofen 400-600mg  every 6-8 hours as needed for pain and inflammation.   Call to schedule a follow up with Sports Medicine or Orthopedist in 1-2 weeks for ongoing care.

## 2020-11-28 NOTE — ED Notes (Signed)
Patient could not tolerate pressure from ankle splint. Ace wrap applied instead.

## 2021-05-29 IMAGING — DX DG ANKLE COMPLETE 3+V*R*
3 series · 3 of 3 positions shown · non-contrast
Comparison: None.

CLINICAL DATA: Right ankle pain after fall from ladder last week.

EXAM:
RIGHT ANKLE - COMPLETE 3+ VIEW

[ankle ap]
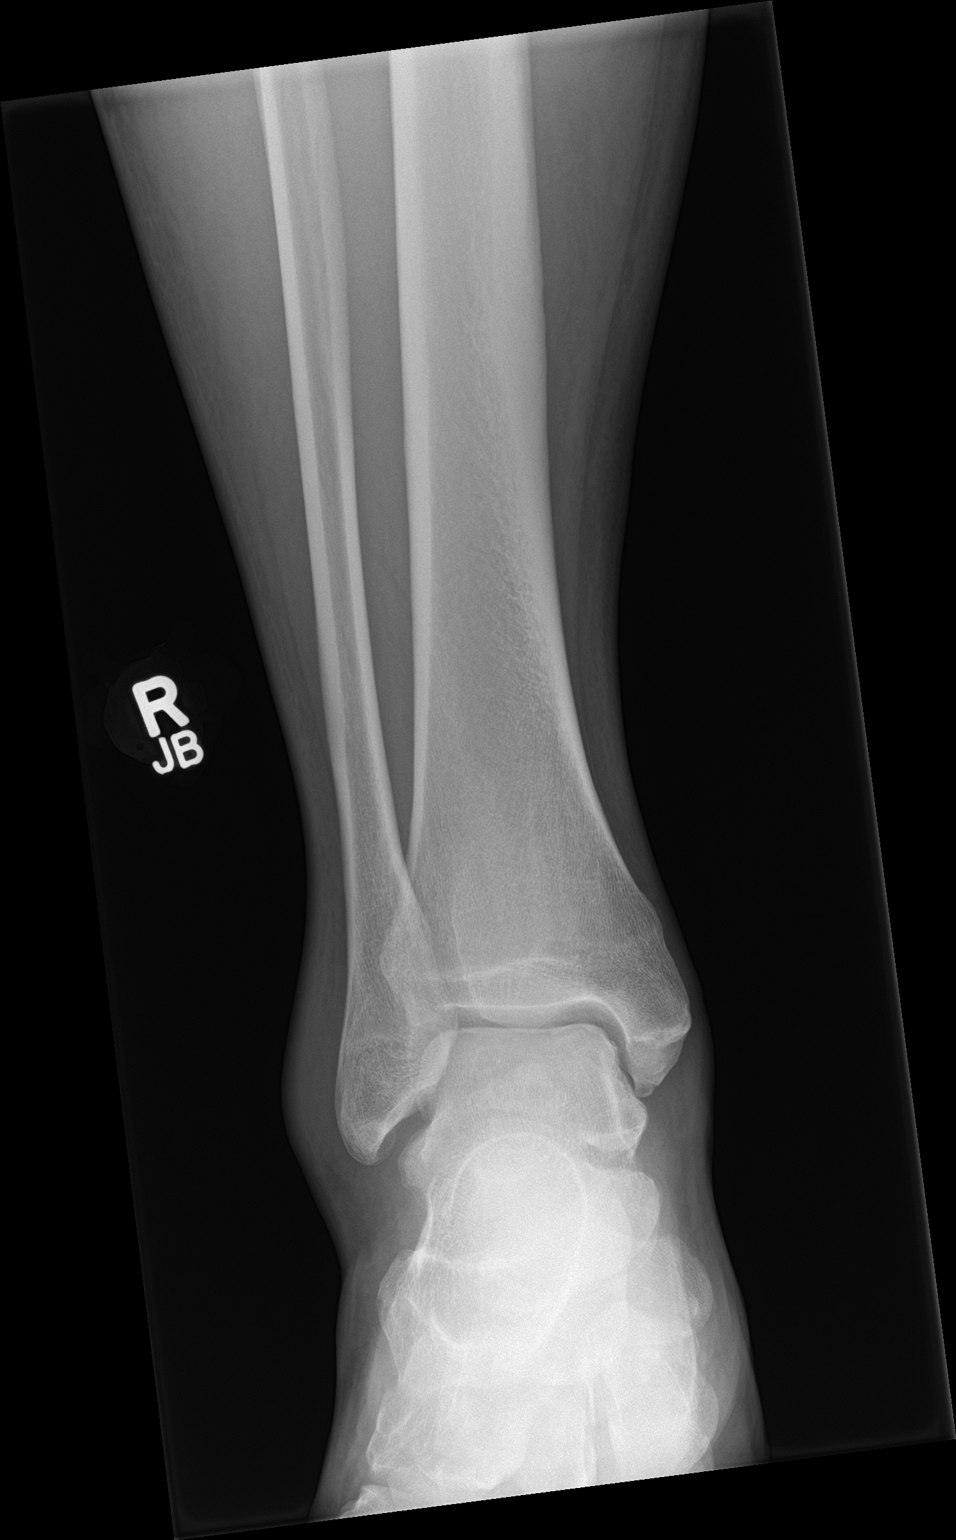

[ankle obl]
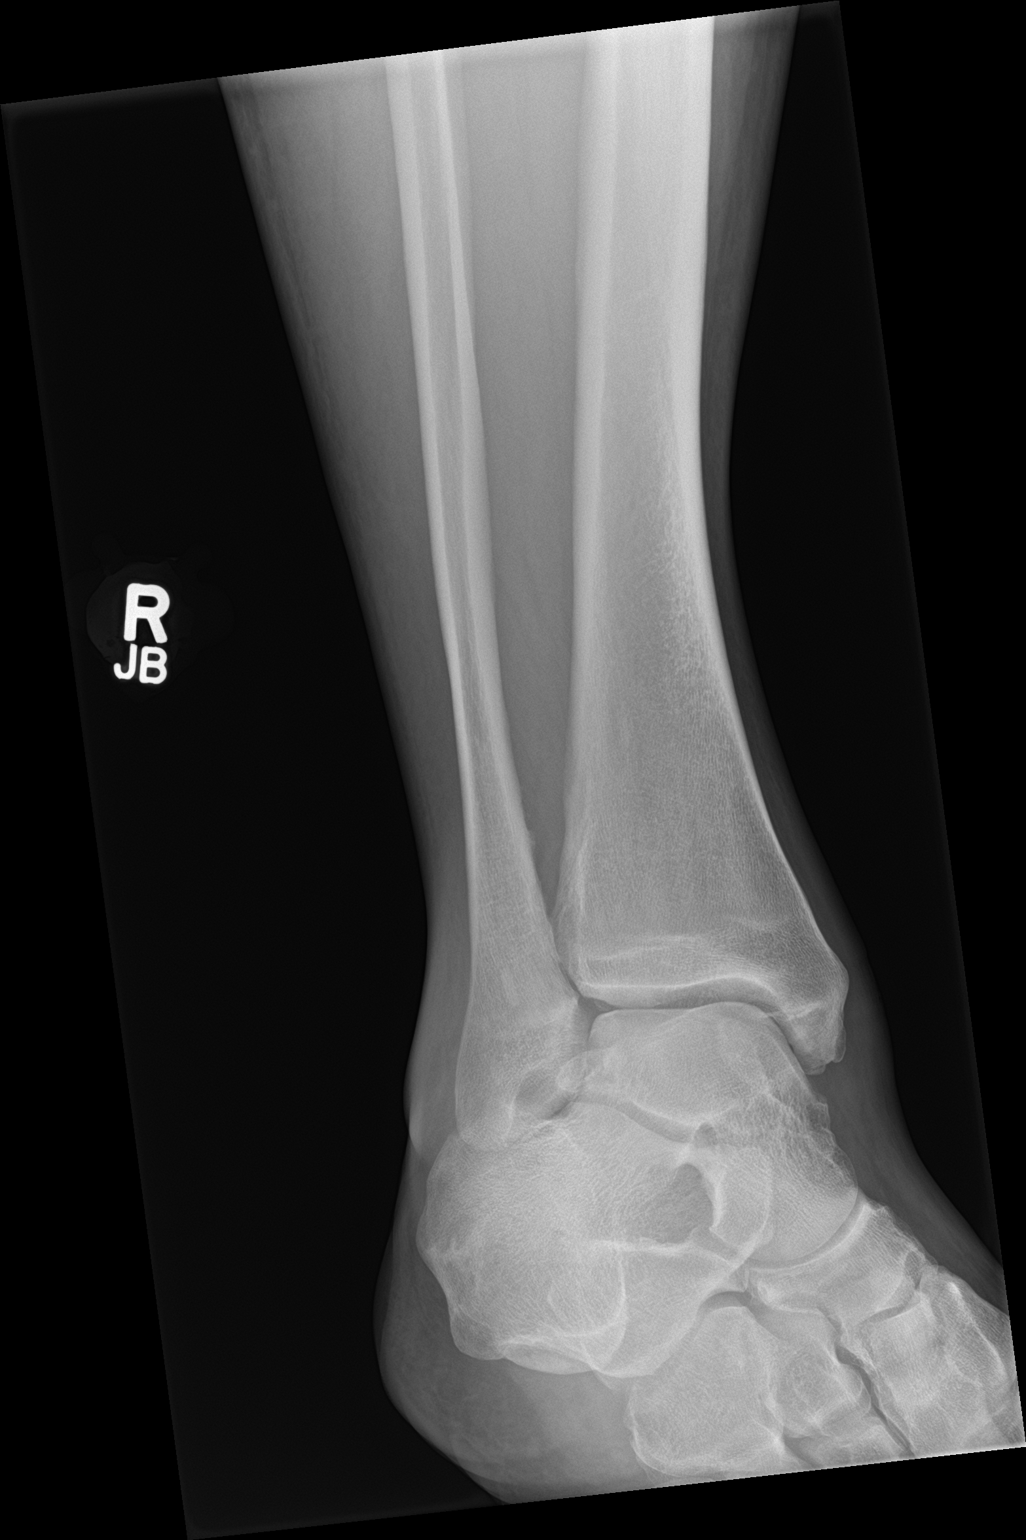

[ankle lat]
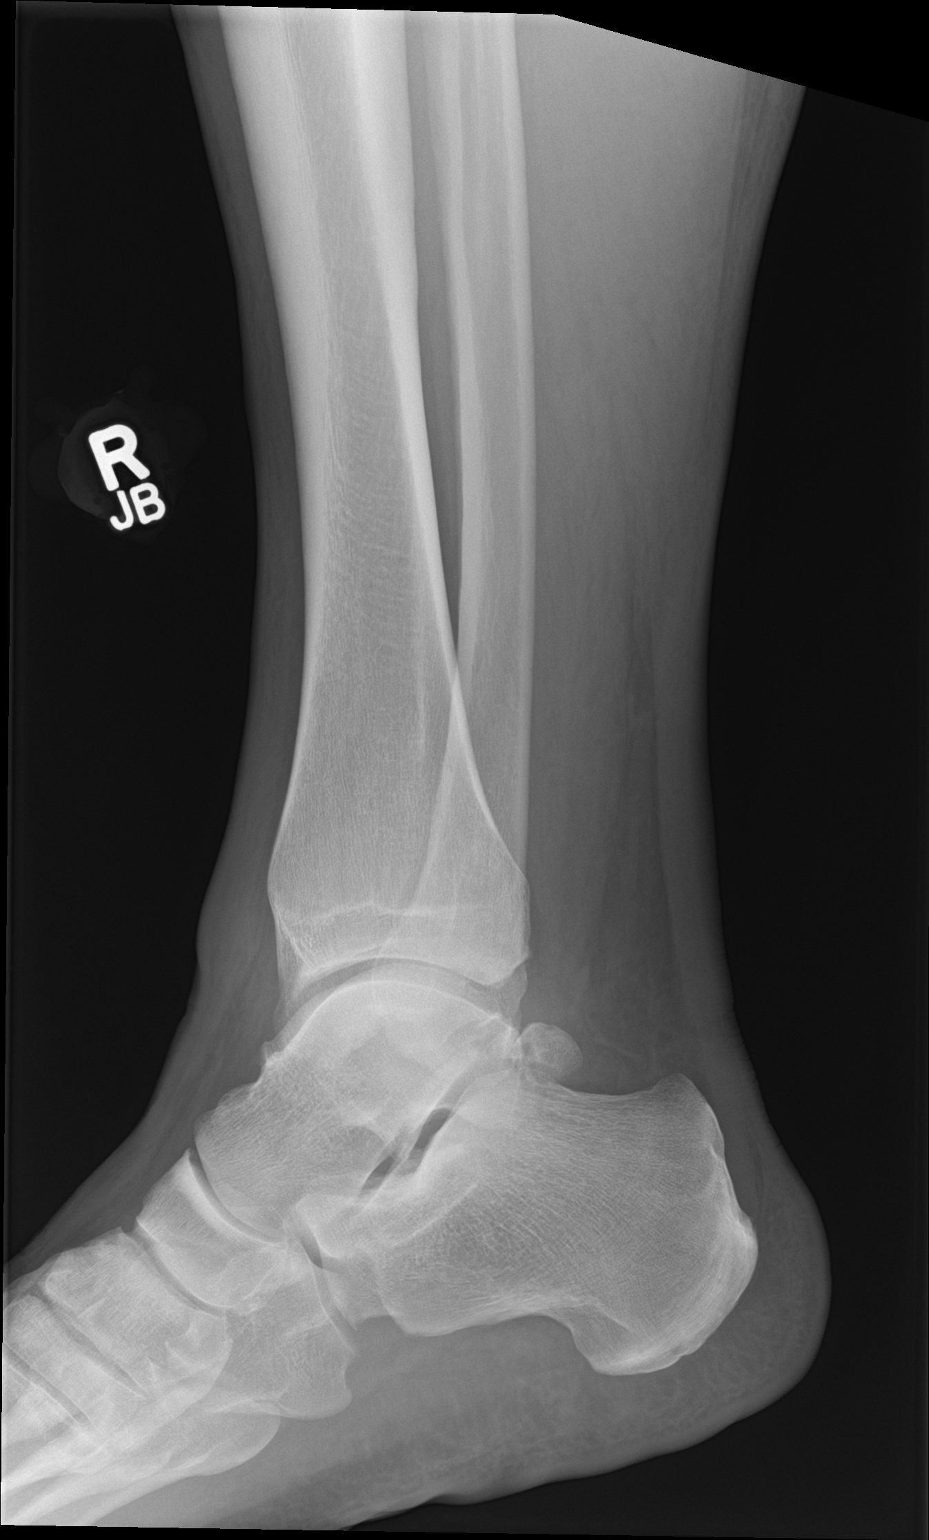

[3 of 3 positions shown; findings below may reference images not displayed]

FINDINGS: There is no evidence of fracture, dislocation, or joint effusion.
There is no evidence of arthropathy or other focal bone abnormality.
Soft tissue swelling is seen over lateral malleolus.
IMPRESSION: No fracture or dislocation is noted. Soft tissue swelling is seen
over lateral malleolus.

## 2021-08-02 ENCOUNTER — Encounter: Payer: Self-pay | Admitting: Emergency Medicine

## 2021-08-02 ENCOUNTER — Other Ambulatory Visit: Payer: Self-pay

## 2021-08-02 ENCOUNTER — Emergency Department
Admission: EM | Admit: 2021-08-02 | Discharge: 2021-08-02 | Disposition: A | Discharge status: Home / Self Care | Payer: BC Managed Care – PPO | Source: Home / Self Care | Attending: Family Medicine | Admitting: Family Medicine

## 2021-08-02 DIAGNOSIS — K1321 Leukoplakia of oral mucosa, including tongue: Secondary | ICD-10-CM

## 2021-08-02 NOTE — Discharge Instructions (Addendum)
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  You need to have the dentist checked once a year to make sure it does not significantly change.  Occasionally they can show early signs of oral cancer.  This is the reason for monitoring.

## 2021-08-02 NOTE — ED Triage Notes (Signed)
Patient here stating 2 weeks of white raised spots on right side of tongue and some on gums; no pain, just an awareness they are present. No other symptoms. Has not had any covid vaccinations.

## 2021-08-03 NOTE — ED Provider Notes (Signed)
[name]    CSN: [id number] Arrival date & time: 08/02/21  1537      History   Chief Complaint Chief Complaint  Patient presents with   Mouth Lesions    HPI Keith Wong is a 50 y.o. male.   HPI  Patient has noticed that he has white patches in his mouth.  They are painless.  He states that 1 time when he ate a bunch of potato chips he felt some burning in his cheeks but this is only 1 event.  They have been there an unknown period of time but he just noticed them 2 weeks ago.  He states that he did mention them to his primary care doctor who told him not to worry about it.  He is here because he looked it up on the Internet and thinks it might be thrush.  He recently was treated for a Candida balanitis.  He worries he may have more of a yeast infection.  No recent antibiotics.  No inhalers with steroids.  No immunodeficiency Past Medical History:  Diagnosis Date   Anxiety    COVID    4/21   Thyroid disease     Patient Active Problem List   Diagnosis Date Noted   OPEN WOUND FINGER WITHOUT MENTION COMPLICATION 09/25/2009   LOW BACK PAIN, CHRONIC 08/07/2009    Past Surgical History:  Procedure Laterality Date   CHOLECYSTECTOMY     HIP SURGERY Right    KNEE SURGERY         Home Medications    Prior to Admission medications   Medication Sig Start Date End Date Taking? Authorizing Provider  cetirizine (ZYRTEC) 10 MG tablet Take 10 mg by mouth daily.    [provider]  hydrOXYzine (ATARAX/VISTARIL) 25 MG tablet Take 1 tablet (25 mg total) by mouth every 6 (six) hours. 12/14/15   Lurene Shadow, PA-C  LEVOTHYROXINE SODIUM PO Take 0.25 mcg by mouth.    [provider]  traMADol (ULTRAM) 50 MG tablet Take 1 tablet (50 mg total) by mouth every 6 (six) hours as needed. 11/28/20   Lurene Shadow, PA-C    Family History Family History  Problem Relation Age of Onset   Heart failure Brother     Social History Social History    Tobacco Use   Smoking status: Former   Smokeless tobacco: Never  Substance Use Topics   Alcohol use: No   Drug use: No     Allergies   Amoxicillin   Review of Systems Review of Systems See HPI  Physical Exam Triage Vital Signs ED Triage Vitals  Enc Vitals Group     BP 08/02/21 1633 (!) 149/91     Pulse Rate 08/02/21 1633 69     Resp 08/02/21 1633 16     Temp 08/02/21 1633 98.5 F (36.9 C)     Temp Source 08/02/21 1633 Oral     SpO2 08/02/21 1633 98 %     Weight 08/02/21 1634 249 lb 1.9 oz (113 kg)     Height 08/02/21 1634 6\' 2"  (1.88 m)     Head Circumference --      Peak Flow --      Pain Score 08/02/21 1633 0     Pain Loc --      Pain Edu? --      Excl. in GC? --    No data found.  Updated Vital Signs BP (!) 149/91 (BP Location: Right Arm)  Pulse 69   Temp 98.5 F (36.9 C) (Oral)   Resp 16   Ht 6\' 2"  (1.88 m)   Wt 113 kg   SpO2 98%   BMI 31.99 kg/m       Physical Exam Constitutional:      General: He is not in acute distress.    Appearance: He is well-developed.  HENT:     Head: Normocephalic and atraumatic.     Mouth/Throat:     Comments: Examination of the oral mucosa reveals lacy white lesions both cheeks, inside the lower lip, and a few on the lateral tongue.  They do not scrape off with tongue depressor.  They do not bleed, have friable tissue, or hurt Eyes:     Conjunctiva/sclera: Conjunctivae normal.     Pupils: Pupils are equal, round, and reactive to light.  Cardiovascular:     Rate and Rhythm: Normal rate.  Pulmonary:     Effort: Pulmonary effort is normal. No respiratory distress.  Abdominal:     General: There is no distension.     Palpations: Abdomen is soft.  Musculoskeletal:        General: Normal range of motion.     Cervical back: Normal range of motion.  Skin:    General: Skin is warm and dry.  Neurological:     Mental Status: He is alert.  Psychiatric:        Mood and Affect: Mood normal.     UC Treatments /  Results  Labs (all labs ordered are listed, but only abnormal results are displayed) Labs Reviewed - No data to display  EKG   Radiology No results found.  Procedures Procedures (including critical care time)  Medications Ordered in UC Medications - No data to display  Initial Impression / Assessment and Plan / UC Course  I have reviewed the triage vital signs and the nursing notes.  Pertinent labs & imaging results that were available during my care of the patient were reviewed by me and considered in my medical decision making (see chart for details).     Patient's patient admits to chewing tobacco use for many years, stopped 2 years ago.  I told him that oral tobacco can cause the changes inside his mouth.  He has not applied care.  I do not see any areas that look malignant but asked that he have his dentist check these at regularly. Final Clinical Impressions(s) / UC Diagnoses   Final diagnoses:  Leukoplakia of oral cavity     Discharge Instructions      I believe the rash on the inside of her mouth is leukoplakia This is a thickened white skin that does not scrape off, usually associated with tobacco use It is harmless.  You need to have the dentist checked once a year to make sure it does not significantly change.  Occasionally they can show early signs of oral cancer.  This is the reason for monitoring.   ED Prescriptions   None    PDMP not reviewed this encounter.   , MD 08/03/21 1009

## 2022-07-10 ENCOUNTER — Encounter: Payer: Self-pay | Admitting: Emergency Medicine

## 2022-07-10 ENCOUNTER — Emergency Department
Admission: EM | Admit: 2022-07-10 | Discharge: 2022-07-10 | Disposition: A | Discharge status: Home / Self Care | Payer: BC Managed Care – PPO | Attending: Family Medicine | Admitting: Family Medicine

## 2022-07-10 DIAGNOSIS — H6123 Impacted cerumen, bilateral: Secondary | ICD-10-CM

## 2022-07-10 NOTE — ED Triage Notes (Signed)
Patient c/o wax build up in left ear x 1 week.  Minimal pain, no drainage from ear.  Patient denies any OTC pain meds.

## 2022-07-10 NOTE — Discharge Instructions (Addendum)
Return as needed

## 2022-07-10 NOTE — ED Provider Notes (Signed)
[name]    CSN: [id number] Arrival date & time: 07/10/22  5573      History   Chief Complaint Chief Complaint  Patient presents with   Cerumen Impaction    HPI Keith Wong is a 51 y.o. male.   HPI Patient states he is prone to earwax buildup.  He apparently cannot hear out of his left ear and it is uncomfortable.  He is here requesting ear lavage Past Medical History:  Diagnosis Date   Anxiety    COVID    4/21   Thyroid disease     Patient Active Problem List   Diagnosis Date Noted   OPEN WOUND FINGER WITHOUT MENTION COMPLICATION 09/25/2009   LOW BACK PAIN, CHRONIC 08/07/2009    Past Surgical History:  Procedure Laterality Date   CHOLECYSTECTOMY     HIP SURGERY Right    KNEE SURGERY         Home Medications    Prior to Admission medications   Medication Sig Start Date End Date Taking? Authorizing Provider  LEVOTHYROXINE SODIUM PO Take 0.25 mcg by mouth.   Yes [provider]  Multiple Vitamin (MULTIVITAMIN) tablet Take 1 tablet by mouth daily.   Yes [provider]    Family History Family History  Problem Relation Age of Onset   Heart failure Brother     Social History Social History   Tobacco Use   Smoking status: Never   Smokeless tobacco: Current  Vaping Use   Vaping Use: Some days  Substance Use Topics   Alcohol use: No   Drug use: No     Allergies   Amoxicillin   Review of Systems Review of Systems See HPI  Physical Exam Triage Vital Signs ED Triage Vitals  Enc Vitals Group     BP 07/10/22 0941 113/79     Pulse Rate 07/10/22 0941 78     Resp 07/10/22 0941 18     Temp 07/10/22 0941 97.8 F (36.6 C)     Temp Source 07/10/22 0941 Oral     SpO2 07/10/22 0941 98 %     Weight 07/10/22 0943 233 lb (105.7 kg)     Height 07/10/22 0943 6\' 2"  (1.88 m)     Head Circumference --      Peak Flow --      Pain Score 07/10/22 0943 1     Pain Loc --      Pain Edu? --      Excl. in GC? --    No  data found.  Updated Vital Signs BP 113/79 (BP Location: Right Arm)   Pulse 78   Temp 97.8 F (36.6 C) (Oral)   Resp 18   Ht 6\' 2"  (1.88 m)   Wt 105.7 kg   SpO2 98%   BMI 29.92 kg/m       Physical Exam Constitutional:      General: He is not in acute distress.    Appearance: He is well-developed.  HENT:     Head: Normocephalic and atraumatic.     Right Ear: There is impacted cerumen.     Left Ear: There is impacted cerumen.  Eyes:     Conjunctiva/sclera: Conjunctivae normal.     Pupils: Pupils are equal, round, and reactive to light.  Cardiovascular:     Rate and Rhythm: Normal rate.  Pulmonary:     Effort: Pulmonary effort is normal. No respiratory distress.  Abdominal:     General: There is  no distension.     Palpations: Abdomen is soft.  Musculoskeletal:        General: Normal range of motion.     Cervical back: Normal range of motion.  Skin:    General: Skin is warm and dry.  Neurological:     Mental Status: He is alert.      UC Treatments / Results  Labs (all labs ordered are listed, but only abnormal results are displayed) Labs Reviewed - No data to display  EKG   Radiology No results found.  Procedures Procedures (including critical care time)  Medications Ordered in UC Medications - No data to display  Initial Impression / Assessment and Plan / UC Course  I have reviewed the triage vital signs and the nursing notes.  Pertinent labs & imaging results that were available during my care of the patient were reviewed by me and considered in my medical decision making (see chart for details).     Cerumen impaction relieved with curettage and lavage.  TMs and canals are normal Final Clinical Impressions(s) / UC Diagnoses   Final diagnoses:  Hearing loss due to cerumen impaction, bilateral     Discharge Instructions      Return as needed     ED Prescriptions   None    PDMP not reviewed this encounter.   Eustace Moore,  MD 07/10/22 1012

## 2022-07-29 ENCOUNTER — Other Ambulatory Visit: Payer: Self-pay

## 2022-07-29 ENCOUNTER — Ambulatory Visit
Admission: EM | Admit: 2022-07-29 | Discharge: 2022-07-29 | Disposition: A | Discharge status: Home / Self Care | Payer: BC Managed Care – PPO | Attending: Family Medicine | Admitting: Family Medicine

## 2022-07-29 ENCOUNTER — Encounter: Payer: Self-pay | Admitting: Emergency Medicine

## 2022-07-29 DIAGNOSIS — S86812A Strain of other muscle(s) and tendon(s) at lower leg level, left leg, initial encounter: Secondary | ICD-10-CM | POA: Diagnosis not present

## 2022-07-29 MED ORDER — METHOCARBAMOL 500 MG PO TABS: 500.0000 mg | ORAL_TABLET | Freq: Three times a day (TID) | ORAL | 0 refills | Status: DC | PRN

## 2022-07-29 MED ORDER — PREDNISONE 10 MG (21) PO TBPK: ORAL_TABLET | Freq: Every day | ORAL | 0 refills | Status: DC

## 2022-07-29 NOTE — Discharge Instructions (Addendum)
Advised patient to take medication as directed with food to completion.  Instructed patient to start Cooper Render tomorrow morning Saturday, 07/30/2022.  Advised may take Robaxin daily or as needed for accompanying muscle spasm/muscle strain of left calf.  Advised may RICE affected area of left calf for 25 minutes 2-3 times daily for the next 2 to 3 days.  Encouraged patient to increase daily water intake while taking these medications.  Advised patient if symptoms worsen and/or unresolved please follow-up with [facility] orthopedic provider for further evaluation.  Contact information for this provider is below.

## 2022-07-29 NOTE — ED Triage Notes (Signed)
Left calf injury tonight while working out heard a pop and felt pain. Doing calf raises.

## 2022-07-29 NOTE — ED Provider Notes (Signed)
[name]    CSN: [id number] Arrival date & time: 07/29/22  1918      History   Chief Complaint Chief Complaint  Patient presents with   Leg Injury    HPI Keith Wong is a 51 y.o. male.   HPI 51 year old male presents with left calf injury while working out tonight heard a pop and felt pain while performing calf raises.  PMH significant for chronic low back pain, anxiety, and thyroid disease.  Past Medical History:  Diagnosis Date   Anxiety    COVID    4/21   Thyroid disease     Patient Active Problem List   Diagnosis Date Noted   OPEN WOUND FINGER WITHOUT MENTION COMPLICATION 09/25/2009   LOW BACK PAIN, CHRONIC 08/07/2009    Past Surgical History:  Procedure Laterality Date   CHOLECYSTECTOMY     HIP SURGERY Right    KNEE SURGERY         Home Medications    Prior to Admission medications   Medication Sig Start Date End Date Taking? Authorizing Provider  LEVOTHYROXINE SODIUM PO Take 0.25 mcg by mouth.    [provider]  methocarbamol (ROBAXIN) 500 MG tablet Take 1 tablet (500 mg total) by mouth 3 (three) times daily as needed for muscle spasms. 07/29/22  Yes Trevor Iha, FNP  Multiple Vitamin (MULTIVITAMIN) tablet Take 1 tablet by mouth daily.    [provider]  predniSONE (STERAPRED UNI-PAK 21 TAB) 10 MG (21) TBPK tablet Take by mouth daily. Take 6 tabs by mouth daily  for 2 days, then 5 tabs for 2 days, then 4 tabs for 2 days, then 3 tabs for 2 days, 2 tabs for 2 days, then 1 tab by mouth daily for 2 days 07/29/22  Yes Trevor Iha, FNP    Family History Family History  Problem Relation Age of Onset   Heart failure Brother     Social History Social History   Tobacco Use   Smoking status: Never   Smokeless tobacco: Current    Types: Snuff  Vaping Use   Vaping Use: Never used  Substance Use Topics   Alcohol use: No   Drug use: No     Allergies   Amoxicillin   Review of Systems Review of Systems   Musculoskeletal:        Left calf injury while working out this evening  All other systems reviewed and are negative.    Physical Exam Triage Vital Signs ED Triage Vitals [07/29/22 1925]  Enc Vitals Group     BP 131/82     Pulse Rate 98     Resp 16     Temp 98.4 F (36.9 C)     Temp Source Oral     SpO2 97 %     Weight      Height      Head Circumference      Peak Flow      Pain Score      Pain Loc      Pain Edu?      Excl. in GC?    No data found.  Updated Vital Signs BP 131/82 (BP Location: Left Arm)   Pulse 98   Temp 98.4 F (36.9 C) (Oral)   Resp 16   Ht 6\' 2"  (1.88 m)   Wt 240 lb (108.9 kg)   SpO2 97%   BMI 30.81 kg/m    Physical Exam Vitals and nursing note reviewed.  Constitutional:  Appearance: Normal appearance. He is normal weight.  HENT:     Head: Normocephalic and atraumatic.     Mouth/Throat:     Mouth: Mucous membranes are moist.     Pharynx: Oropharynx is clear.  Eyes:     Extraocular Movements: Extraocular movements intact.     Conjunctiva/sclera: Conjunctivae normal.     Pupils: Pupils are equal, round, and reactive to light.  Cardiovascular:     Rate and Rhythm: Normal rate and regular rhythm.     Pulses: Normal pulses.     Heart sounds: Normal heart sounds. No murmur heard. Pulmonary:     Effort: Pulmonary effort is normal.     Breath sounds: Normal breath sounds. No wheezing, rhonchi or rales.  Musculoskeletal:     Cervical back: Normal range of motion and neck supple.     Comments: Left calf: TTP over mid lateral aspect of gastrocnemius, antalgic gait noted  Skin:    General: Skin is warm and dry.  Neurological:     General: No focal deficit present.     Mental Status: He is alert and oriented to person, place, and time. Mental status is at baseline.      UC Treatments / Results  Labs (all labs ordered are listed, but only abnormal results are displayed) Labs Reviewed - No data to display  EKG   Radiology No  results found.  Procedures Procedures (including critical care time)  Medications Ordered in UC Medications - No data to display  Initial Impression / Assessment and Plan / UC Course  I have reviewed the triage vital signs and the nursing notes.  Pertinent labs & imaging results that were available during my care of the patient were reviewed by me and considered in my medical decision making (see chart for details).     MDM: 1.  Strain of calf muscle, left, initial encounter-Rx'd Sterapred Unipak, Robaxin. Advised patient to take medication as directed with food to completion.  Instructed patient to start Cooper Render tomorrow morning Saturday, 07/30/2022.  Advised may take Robaxin daily or as needed for accompanying muscle spasm/muscle strain of left calf.  Advised may RICE affected area of left calf for 25 minutes 2-3 times daily for the next 2 to 3 days.  Encouraged patient to increase daily water intake while taking these medications.  Advised patient if symptoms worsen and/or unresolved please follow-up with [facility] orthopedic provider for further evaluation.  Contact information for this provider is below.  Final Clinical Impressions(s) / UC Diagnoses   Final diagnoses:  Strain of calf muscle, left, initial encounter     Discharge Instructions      Advised patient to take medication as directed with food to completion.  Instructed patient to start Cooper Render tomorrow morning Saturday, 07/30/2022.  Advised may take Robaxin daily or as needed for accompanying muscle spasm/muscle strain of left calf.  Advised may RICE affected area of left calf for 25 minutes 2-3 times daily for the next 2 to 3 days.  Encouraged patient to increase daily water intake while taking these medications.  Advised patient if symptoms worsen and/or unresolved please follow-up with [facility] orthopedic provider for further evaluation.  Contact information for this provider is below.     ED  Prescriptions     Medication Sig Dispense Auth. Provider   predniSONE (STERAPRED UNI-PAK 21 TAB) 10 MG (21) TBPK tablet Take by mouth daily. Take 6 tabs by mouth daily  for 2 days, then 5 tabs  for 2 days, then 4 tabs for 2 days, then 3 tabs for 2 days, 2 tabs for 2 days, then 1 tab by mouth daily for 2 days 42 tablet Trevor Iha, FNP   methocarbamol (ROBAXIN) 500 MG tablet Take 1 tablet (500 mg total) by mouth 3 (three) times daily as needed for muscle spasms. 30 tablet Trevor Iha, FNP      PDMP not reviewed this encounter.   Trevor Iha, FNP 07/29/22 1948

## 2022-07-30 ENCOUNTER — Telehealth: Payer: Self-pay | Admitting: Family Medicine

## 2022-07-30 ENCOUNTER — Telehealth: Payer: Self-pay

## 2022-07-30 NOTE — Telephone Encounter (Signed)
Follow up call.  Pt states that he is still in pain.  No improvement yet.  Pt states that he will be picking up medication today.  Pt was instructed to give office a call if anything changes.

## 2022-07-30 NOTE — Telephone Encounter (Signed)
Patient called earlier today to speak with staff member reporting that left calf pain has increased.  Patient had not started either medication that was prescribed yesterday evening Sterapred Unipak or Robaxin.  Advised patient that advanced imaging, specifically MRI of left calf would be necessary to look for left calf muscle/left gastrocnemius tear.  Patient has been advised to follow-up with Clare Gandy, MD on Monday morning 08/01/2022 for further evaluation.  Patient reports has now received medications for pharmacy and will take as previously directed.  Advised patient to continue to RICE left calf and to stay off affected muscle is much as possible until being evaluated by [facility] orthopedic provider on Monday morning.

## 2024-01-02 ENCOUNTER — Ambulatory Visit
Admission: EM | Admit: 2024-01-02 | Discharge: 2024-01-02 | Disposition: A | Discharge status: Home / Self Care | Payer: BC Managed Care – PPO | Attending: Family Medicine | Admitting: Family Medicine

## 2024-01-02 ENCOUNTER — Other Ambulatory Visit: Payer: Self-pay

## 2024-01-02 DIAGNOSIS — H9202 Otalgia, left ear: Secondary | ICD-10-CM | POA: Diagnosis not present

## 2024-01-02 DIAGNOSIS — H66002 Acute suppurative otitis media without spontaneous rupture of ear drum, left ear: Secondary | ICD-10-CM

## 2024-01-02 MED ORDER — FLUTICASONE PROPIONATE 50 MCG/ACT NA SUSP: 2.0000 | Freq: Every day | NASAL | 0 refills | Status: AC

## 2024-01-02 MED ORDER — IBUPROFEN 800 MG PO TABS: 800.0000 mg | ORAL_TABLET | Freq: Three times a day (TID) | ORAL | 0 refills | Status: AC

## 2024-01-02 MED ORDER — CEFDINIR 300 MG PO CAPS: 300.0000 mg | ORAL_CAPSULE | Freq: Two times a day (BID) | ORAL | 0 refills | Status: AC

## 2024-01-02 NOTE — ED Provider Notes (Signed)
 [facility]    CSN: [id number] Arrival date & time: 01/02/24  1607      History   Chief Complaint Chief Complaint  Patient presents with   Otalgia    LT    HPI Keith Wong is a 53 y.o. male.   HPI  Patient is here for left ear pain.  It has been going on for several days.  He has mild cold symptoms.  He does not usually have problems with his ears.  Hearing is normal  Past Medical History:  Diagnosis Date   Anxiety    COVID    4/21   Thyroid disease     Patient Active Problem List   Diagnosis Date Noted   Open wound of finger 09/25/2009   LOW BACK PAIN, CHRONIC 08/07/2009    Past Surgical History:  Procedure Laterality Date   CHOLECYSTECTOMY     HIP SURGERY Right    KNEE SURGERY         Home Medications    Prior to Admission medications   Medication Sig Start Date End Date Taking? Authorizing Provider  ARMOUR THYROID 120 MG tablet Take 120 mg by mouth daily. 12/11/23  Yes [provider]  cefdinir  (OMNICEF ) 300 MG capsule Take 1 capsule (300 mg total) by mouth 2 (two) times daily. 01/02/24  Yes Maranda Jamee Jacob, MD  fluticasone  (FLONASE ) 50 MCG/ACT nasal spray Place 2 sprays into both nostrils daily. 01/02/24  Yes Maranda Jamee Jacob, MD  ibuprofen  (ADVIL ) 800 MG tablet Take 1 tablet (800 mg total) by mouth 3 (three) times daily. 01/02/24  Yes Maranda Jamee Jacob, MD  Multiple Vitamin (MULTIVITAMIN) tablet Take 1 tablet by mouth daily.    [provider]    Family History Family History  Problem Relation Age of Onset   Heart failure Brother     Social History Social History   Tobacco Use   Smoking status: Never   Smokeless tobacco: Current    Types: Snuff  Vaping Use   Vaping status: Never Used  Substance Use Topics   Alcohol use: No   Drug use: No     Allergies   Amoxicillin   Review of Systems Review of Systems  See HPI Physical Exam Triage Vital Signs ED Triage Vitals  Encounter Vitals Group      BP 01/02/24 1629 127/80     Systolic BP Percentile --      Diastolic BP Percentile --      Pulse Rate 01/02/24 1629 83     Resp 01/02/24 1629 17     Temp 01/02/24 1629 (!) 97.4 F (36.3 C)     Temp Source 01/02/24 1629 Oral     SpO2 01/02/24 1629 100 %     Weight --      Height --      Head Circumference --      Peak Flow --      Pain Score 01/02/24 1630 0     Pain Loc --      Pain Education --      Exclude from Growth Chart --    No data found.  Updated Vital Signs BP 127/80 (BP Location: Left Arm)   Pulse 83   Temp (!) 97.4 F (36.3 C) (Oral)   Resp 17   SpO2 100%      Physical Exam Constitutional:      General: He is not in acute distress.    Appearance: He is well-developed.  HENT:  Head: Normocephalic and atraumatic.     Right Ear: Tympanic membrane and ear canal normal.     Left Ear: There is impacted cerumen.     Ears:     Comments: Right TM is dull and erythematous    Nose: Nose normal. No congestion.     Mouth/Throat:     Mouth: Mucous membranes are moist.     Pharynx: No posterior oropharyngeal erythema.  Eyes:     Conjunctiva/sclera: Conjunctivae normal.     Pupils: Pupils are equal, round, and reactive to light.  Cardiovascular:     Rate and Rhythm: Normal rate and regular rhythm.  Pulmonary:     Effort: Pulmonary effort is normal. No respiratory distress.     Breath sounds: Normal breath sounds.  Abdominal:     General: There is no distension.     Palpations: Abdomen is soft.  Musculoskeletal:        General: Normal range of motion.     Cervical back: Normal range of motion.  Lymphadenopathy:     Cervical: No cervical adenopathy.  Skin:    General: Skin is warm and dry.  Neurological:     Mental Status: He is alert.      UC Treatments / Results  Labs (all labs ordered are listed, but only abnormal results are displayed) Labs Reviewed - No data to display  EKG   Radiology No results found.  Procedures Procedures (including  critical care time)  Medications Ordered in UC Medications - No data to display  Initial Impression / Assessment and Plan / UC Course  I have reviewed the triage vital signs and the nursing notes.  Pertinent labs & imaging results that were available during my care of the patient were reviewed by me and considered in my medical decision making (see chart for details).     Final Clinical Impressions(s) / UC Diagnoses   Final diagnoses:  Otalgia of left ear  Non-recurrent acute suppurative otitis media of left ear without spontaneous rupture of tympanic membrane     Discharge Instructions      Make sure you are drinking lots of water Use the Flonase  daily until your symptoms resolve Cefdinir  antibiotic 2 times a day for 10 days I have prescribed ibuprofen  to take 3 times a day as needed for pain Call return if not improving in a couple days   ED Prescriptions     Medication Sig Dispense Auth. Provider   fluticasone  (FLONASE ) 50 MCG/ACT nasal spray Place 2 sprays into both nostrils daily. 16 g Maranda Jamee Jacob, MD   cefdinir  (OMNICEF ) 300 MG capsule Take 1 capsule (300 mg total) by mouth 2 (two) times daily. 20 capsule Maranda Jamee Jacob, MD   ibuprofen  (ADVIL ) 800 MG tablet Take 1 tablet (800 mg total) by mouth 3 (three) times daily. 21 tablet Maranda Jamee Jacob, MD      PDMP not reviewed this encounter.   Maranda Jamee Jacob, MD 01/02/24 [phone]

## 2024-01-02 NOTE — ED Triage Notes (Signed)
 Pt c/o LT ear pain since Sat. Taking ibuprofen and aspirin prn.

## 2024-01-02 NOTE — Discharge Instructions (Signed)
 Make sure you are drinking lots of water Use the Flonase daily until your symptoms resolve Cefdinir antibiotic 2 times a day for 10 days I have prescribed ibuprofen to take 3 times a day as needed for pain Call return if not improving in a couple days

## 2024-02-25 ENCOUNTER — Other Ambulatory Visit: Payer: Self-pay

## 2024-02-25 ENCOUNTER — Encounter: Payer: Self-pay | Admitting: Emergency Medicine

## 2024-02-25 ENCOUNTER — Ambulatory Visit
Admission: EM | Admit: 2024-02-25 | Discharge: 2024-02-25 | Disposition: A | Discharge status: Home / Self Care | Attending: Family Medicine | Admitting: Family Medicine

## 2024-02-25 DIAGNOSIS — B029 Zoster without complications: Secondary | ICD-10-CM

## 2024-02-25 MED ORDER — VALACYCLOVIR HCL 1 G PO TABS: 1000.0000 mg | ORAL_TABLET | Freq: Three times a day (TID) | ORAL | 0 refills | Status: AC

## 2024-02-25 NOTE — ED Triage Notes (Signed)
 Patient c/o rash that has developed on the left side of his back since Wednesday.  It started with 2 small bumps and has progressed since.  The rash is extremely painful and skin is painful to touch.  Patient did have chicken pox as a child.

## 2024-02-25 NOTE — Discharge Instructions (Addendum)
 Advised patient to take medication as directed with food to completion.  Encouraged to increase daily water intake to 64 ounces per day while taking these medications.  Advised if symptoms worsen and/or unresolved please follow-up with your PCP or here for further evaluation.

## 2024-02-25 NOTE — ED Provider Notes (Signed)
 [name]    CSN: [id number] Arrival date & time: 02/25/24  1052      History   Chief Complaint Chief Complaint  Patient presents with   Rash    HPI Keith Wong is a 53 y.o. male.   HPI 53 year old male presents with rash to left side of back for 3 to 4 days.  Reports rash is extremely painful.  Reports did have chickenpox as a child.  Past Medical History:  Diagnosis Date   Anxiety    COVID    4/21   Thyroid disease     Patient Active Problem List   Diagnosis Date Noted   Open wound of finger 09/25/2009   LOW BACK PAIN, CHRONIC 08/07/2009    Past Surgical History:  Procedure Laterality Date   CHOLECYSTECTOMY     HIP SURGERY Right    KNEE SURGERY         Home Medications    Prior to Admission medications   Medication Sig Start Date End Date Taking? Authorizing Provider  ARMOUR THYROID 120 MG tablet Take 120 mg by mouth daily. 12/11/23  Yes [provider]  Multiple Vitamin (MULTIVITAMIN) tablet Take 1 tablet by mouth daily.   Yes [provider]  valACYclovir (VALTREX) 1000 MG tablet Take 1 tablet (1,000 mg total) by mouth 3 (three) times daily. 02/25/24  Yes Trevor Iha, FNP  cefdinir (OMNICEF) 300 MG capsule Take 1 capsule (300 mg total) by mouth 2 (two) times daily. 01/02/24   Eustace Moore, MD  fluticasone [facility]) 50 MCG/ACT nasal spray Place 2 sprays into both nostrils daily. 01/02/24   Eustace Moore, MD  ibuprofen (ADVIL) 800 MG tablet Take 1 tablet (800 mg total) by mouth 3 (three) times daily. 01/02/24   Eustace Moore, MD    Family History Family History  Problem Relation Age of Onset   Heart failure Brother     Social History Social History   Tobacco Use   Smoking status: Never   Smokeless tobacco: Current    Types: Snuff  Vaping Use   Vaping status: Never Used  Substance Use Topics   Alcohol use: No   Drug use: No     Allergies   Amoxicillin   Review of Systems Review of  Systems   Physical Exam Triage Vital Signs ED Triage Vitals  Encounter Vitals Group     BP      Systolic BP Percentile      Diastolic BP Percentile      Pulse      Resp      Temp      Temp src      SpO2      Weight      Height      Head Circumference      Peak Flow      Pain Score      Pain Loc      Pain Education      Exclude from Growth Chart    No data found.  Updated Vital Signs BP (!) 146/90 (BP Location: Right Arm)   Pulse 77   Temp 97.9 F (36.6 C) (Oral)   Resp 18   Ht 6\' 2"  (1.88 m)   Wt 235 lb (106.6 kg)   SpO2 99%   BMI 30.17 kg/m    Physical Exam Vitals and nursing note reviewed.  Constitutional:      Appearance: Normal appearance. He is normal weight.  HENT:  Head: Normocephalic and atraumatic.     Mouth/Throat:     Mouth: Mucous membranes are moist.     Pharynx: Oropharynx is clear.  Eyes:     Extraocular Movements: Extraocular movements intact.     Conjunctiva/sclera: Conjunctivae normal.     Pupils: Pupils are equal, round, and reactive to light.  Cardiovascular:     Rate and Rhythm: Normal rate and regular rhythm.     Pulses: Normal pulses.     Heart sounds: Normal heart sounds.  Pulmonary:     Effort: Pulmonary effort is normal.     Breath sounds: Normal breath sounds. No wheezing, rhonchi or rales.  Musculoskeletal:        General: Normal range of motion.     Cervical back: Normal range of motion and neck supple.  Skin:    General: Skin is warm and dry.     Comments: Left sided lower back: Multiple erythematous grouped vesicular lesions, painful per patient-please see image below  Neurological:     General: No focal deficit present.     Mental Status: He is alert and oriented to person, place, and time. Mental status is at baseline.  Psychiatric:        Mood and Affect: Mood normal.        Behavior: Behavior normal.      UC Treatments / Results  Labs (all labs ordered are listed, but only abnormal results are  displayed) Labs Reviewed - No data to display  EKG   Radiology No results found.  Procedures Procedures (including critical care time)  Medications Ordered in UC Medications - No data to display  Initial Impression / Assessment and Plan / UC Course  I have reviewed the triage vital signs and the nursing notes.  Pertinent labs & imaging results that were available during my care of the patient were reviewed by me and considered in my medical decision making (see chart for details).     MDM: 1.  Herpes zoster without complication-Rx'd Valtrex 1000 MG tablet: Take 1 tablet 3 times daily x 7 days. Advised patient to take medication as directed with food to completion.  Encouraged to increase daily water intake to 64 ounces per day while taking these medications.  Advised if symptoms worsen and/or unresolved please follow-up with your PCP or here for further evaluation. Final Clinical Impressions(s) / UC Diagnoses   Final diagnoses:  Herpes zoster without complication     Discharge Instructions      Advised patient to take medication as directed with food to completion.  Encouraged to increase daily water intake to 64 ounces per day while taking these medications.  Advised if symptoms worsen and/or unresolved please follow-up with your PCP or here for further evaluation.     ED Prescriptions     Medication Sig Dispense Auth. Provider   valACYclovir (VALTREX) 1000 MG tablet Take 1 tablet (1,000 mg total) by mouth 3 (three) times daily. 21 tablet Trevor Iha, FNP      PDMP not reviewed this encounter.   Trevor Iha, FNP 02/25/24 1159
